# Patient Record
Sex: Male | Born: 1955 | Race: White | Hispanic: No | Marital: Married | Smoking: Never smoker
Health system: Southern US, Community
[De-identification: ages and names within clinical notes are randomized; demographics above are authoritative.]

## PROBLEM LIST (undated history)

## (undated) DIAGNOSIS — K219 Gastro-esophageal reflux disease without esophagitis: Secondary | ICD-10-CM

## (undated) DIAGNOSIS — Z973 Presence of spectacles and contact lenses: Secondary | ICD-10-CM

## (undated) DIAGNOSIS — N201 Calculus of ureter: Secondary | ICD-10-CM

## (undated) DIAGNOSIS — E785 Hyperlipidemia, unspecified: Secondary | ICD-10-CM

## (undated) HISTORY — PX: TONSILLECTOMY: SUR1361

## (undated) HISTORY — DX: Hyperlipidemia, unspecified: E78.5

## (undated) HISTORY — PX: OTHER SURGICAL HISTORY: SHX169

## (undated) HISTORY — PX: INGUINAL HERNIA REPAIR: SUR1180

## (undated) HISTORY — DX: Gastro-esophageal reflux disease without esophagitis: K21.9

---

## 2005-03-07 ENCOUNTER — Observation Stay (HOSPITAL_COMMUNITY): Admission: EM | Admit: 2005-03-07 | Discharge: 2005-03-07 | Payer: Self-pay | Admitting: Emergency Medicine

## 2005-05-25 HISTORY — PX: COLONOSCOPY: SHX174

## 2012-11-08 ENCOUNTER — Ambulatory Visit (INDEPENDENT_AMBULATORY_CARE_PROVIDER_SITE_OTHER): Payer: PRIVATE HEALTH INSURANCE | Admitting: Emergency Medicine

## 2012-11-08 VITALS — BP 120/79 | HR 63 | Temp 97.8°F | Resp 16 | Ht 70.5 in | Wt 234.0 lb

## 2012-11-08 DIAGNOSIS — H8110 Benign paroxysmal vertigo, unspecified ear: Secondary | ICD-10-CM

## 2012-11-08 MED ORDER — ONDANSETRON HCL 4 MG/2ML IJ SOLN
4.0000 mg | Freq: Once | INTRAMUSCULAR | Status: AC
  Administered 2012-11-08: 4 mg via INTRAMUSCULAR

## 2012-11-08 MED ORDER — ONDANSETRON 8 MG PO TBDP: 8.0000 mg | ORAL_TABLET | Freq: Three times a day (TID) | ORAL | Status: DC | PRN

## 2012-11-08 MED ORDER — ONDANSETRON HCL 4 MG/2ML IJ SOLN: 2.0000 mg | Freq: Once | INTRAMUSCULAR | Status: DC

## 2012-11-08 NOTE — Addendum Note (Signed)
Addended by: Braxton Feathers on: 11/08/2012 09:44 AM   Modules accepted: Orders

## 2012-11-08 NOTE — Patient Instructions (Addendum)

## 2012-11-08 NOTE — Progress Notes (Signed)
[facility] [address], [place] [address] [phone]  Date:  11/08/2012   Name:  Michael Diaz   DOB:  Sep 03, 1955   MRN:  [id number]  PCP:  Georgann Housekeeper, MD    Chief Complaint: Dizziness and Emesis   History of Present Illness:  Michael Diaz is a 57 y.o. very pleasant male patient who presents with the following:   Ill with vertigo and nausea since Sunday.  [place] No head injury, nasal drainage, fever or chills.  No antecedent illness.  Nauseated this morning and vomiting in the office.  Says worse with change in orientation of head.  No improvement with over the counter medications or other home remedies. Denies other complaint or health concern today.  Nonsmoker, no high blood pressure.  No diabetes.   There are no active problems to display for this patient.   Past Medical History  Diagnosis Date  . GERD (gastroesophageal reflux disease)   . Hyperlipidemia     Past Surgical History  Procedure Laterality Date  . Hernia repair      History  Substance Use Topics  . Smoking status: Never Smoker   . Smokeless tobacco: Not on file  . Alcohol Use: Yes    Family History  Problem Relation Age of Onset  . Alzheimer's disease Mother   . Alzheimer's disease Father     No Known Allergies  Medication list has been reviewed and updated.  No current outpatient prescriptions on file prior to visit.   No current facility-administered medications on file prior to visit.    Review of Systems: As per HPI, otherwise negative.    Physical Examination: Filed Vitals:   11/08/12 0857  BP: 120/79  Pulse: 63  Temp: 97.8 F (36.6 C)  Resp: 16   Filed Vitals:   11/08/12 0857  Height: 5' 10.5" (1.791 m)  Weight: 234 lb (106.142 kg)   Body mass index is 33.09 kg/(m^2). Ideal Body Weight: Weight in (lb) to have BMI = 25: 176.4  GEN: WDWN, moderate distress, Non-toxic, A & O x 3 HEENT: Atraumatic,  Normocephalic. Neck supple. No masses, No LAD. Ears and Nose: No external deformity. CV: RRR, No M/G/R. No JVD. No thrill. No extra heart sounds. PULM: CTA B, no wheezes, crackles, rhonchi. No retractions. No resp. distress. No accessory muscle use. ABD: S, NT, ND, +BS. No rebound. No HSM. EXTR: No c/c/e NEURO impaired tandem gait and romberg.  Gross motor intact.  AAOx3, MAE, CN 2-12 intact.  PSYCH: Normally interactive. Conversant. Not depressed or anxious appearing.  Calm demeanor.    Assessment and Plan: Benign positional vertigo zofran antivert   Signed,  Phillips Odor, MD

## 2012-12-16 ENCOUNTER — Ambulatory Visit (INDEPENDENT_AMBULATORY_CARE_PROVIDER_SITE_OTHER): Payer: PRIVATE HEALTH INSURANCE | Admitting: Emergency Medicine

## 2012-12-16 VITALS — BP 110/74 | HR 85 | Temp 97.5°F | Resp 18 | Ht 69.5 in | Wt 236.0 lb

## 2012-12-16 DIAGNOSIS — J069 Acute upper respiratory infection, unspecified: Secondary | ICD-10-CM

## 2012-12-16 MED ORDER — PSEUDOEPHEDRINE-GUAIFENESIN ER 60-600 MG PO TB12: 1.0000 | ORAL_TABLET | Freq: Two times a day (BID) | ORAL | Status: DC

## 2012-12-16 MED ORDER — GUAIFENESIN-DM 100-10 MG/5ML PO SYRP: 5.0000 mL | ORAL_SOLUTION | Freq: Four times a day (QID) | ORAL | Status: DC | PRN

## 2012-12-16 NOTE — Progress Notes (Signed)
  Subjective:     Michael Diaz is a 57 y.o. male who presents for evaluation of symptoms of a URI. Symptoms include congestion, cough described as nonproductive and low grade fever. Onset of symptoms was 5 days ago, and has been gradually improving since that time. Treatment to date: antihistamines, cough suppressants and decongestants.  The following portions of the patient's history were reviewed and updated as appropriate: allergies, current medications, past family history, past medical history, past social history, past surgical history and problem list.  Review of Systems A comprehensive review of systems was negative.   Objective:    BP 110/74  Pulse 85  Temp(Src) 97.5 F (36.4 C) (Oral)  Resp 18  Ht 5' 9.5" (1.765 m)  Wt 236 lb (107.049 kg)  BMI 34.36 kg/m2  SpO2 96%  General Appearance:    Alert, cooperative, no distress, appears stated age  Head:    Normocephalic, without obvious abnormality, atraumatic  Eyes:    PERRL, conjunctiva/corneas clear, EOM's intact, fundi    benign, both eyes       Ears:    Normal TM's and external ear canals, both ears  Nose:   Nares normal, septum midline, mucosa normal, no drainage    or sinus tenderness  Throat:   Lips, mucosa, and tongue normal; teeth and gums normal  Neck:   Supple, symmetrical, trachea midline, no adenopathy;       thyroid:  No enlargement/tenderness/nodules; no carotid   bruit or JVD  Back:     Symmetric, no curvature, ROM normal, no CVA tenderness  Lungs:     Clear to auscultation bilaterally, respirations unlabored  Chest wall:    No tenderness or deformity  Heart:    Regular rate and rhythm, S1 and S2 normal, no murmur, rub   or gallop           Extremities:   Extremities normal, atraumatic, no cyanosis or edema     Skin:   Skin color, texture, turgor normal, no rashes or lesions  Lymph nodes:   Cervical, supraclavicular, and axillary nodes normal  Neurologic:   CNII-XII intact. Normal strength, sensation and  reflexes      throughout     Assessment:    viral upper respiratory illness   Plan:    Discussed diagnosis and treatment of URI. Suggested symptomatic OTC remedies. Follow up as needed.

## 2012-12-16 NOTE — Patient Instructions (Addendum)

## 2013-12-11 ENCOUNTER — Ambulatory Visit (INDEPENDENT_AMBULATORY_CARE_PROVIDER_SITE_OTHER): Payer: PRIVATE HEALTH INSURANCE | Admitting: Family Medicine

## 2013-12-11 VITALS — BP 122/74 | HR 77 | Temp 98.1°F | Resp 16 | Ht 71.0 in | Wt 238.4 lb

## 2013-12-11 DIAGNOSIS — T148 Other injury of unspecified body region: Secondary | ICD-10-CM

## 2013-12-11 DIAGNOSIS — W57XXXA Bitten or stung by nonvenomous insect and other nonvenomous arthropods, initial encounter: Secondary | ICD-10-CM

## 2013-12-11 DIAGNOSIS — J069 Acute upper respiratory infection, unspecified: Secondary | ICD-10-CM

## 2013-12-11 DIAGNOSIS — R21 Rash and other nonspecific skin eruption: Secondary | ICD-10-CM

## 2013-12-11 MED ORDER — AMOXICILLIN-POT CLAVULANATE 875-125 MG PO TABS: 1.0000 | ORAL_TABLET | Freq: Two times a day (BID) | ORAL | Status: DC

## 2013-12-11 NOTE — Progress Notes (Signed)
Chief Complaint:  Chief Complaint  Patient presents with  . Insect Bite    on the left side and back  . Sinusitis    x 1 week    HPI: Michael Diaz is a 58 y.o. male who is here for  1 week history of cold like sxs, diarrhea, head aches, sinus aches, ear popping, had fevers and chills earlier on ,  no rashes. HE was in [place] traveling at the time.  He also has a bug bite on his left flank near belt line, it stung so he does not think it was a tick. IT is itchy and red. Minimally tender.    Past Medical History  Diagnosis Date  . GERD (gastroesophageal reflux disease)   . Hyperlipidemia    Past Surgical History  Procedure Laterality Date  . Hernia repair     History   Social History  . Marital Status: Married    Spouse Name: N/A    Number of Children: N/A  . Years of Education: N/A   Social History Main Topics  . Smoking status: Never Smoker   . Smokeless tobacco: None  . Alcohol Use: Yes  . Drug Use: No  . Sexual Activity: None   Other Topics Concern  . None   Social History Narrative  . None   Family History  Problem Relation Age of Onset  . Alzheimer's disease Mother   . Alzheimer's disease Father    No Known Allergies Prior to Admission medications   Not on File     ROS: The patient denies currently fevers, chills, night sweats, unintentional weight loss, chest pain, palpitations, wheezing, dyspnea on exertion, nausea, vomiting, abdominal pain, dysuria, hematuria, melena, numbness, weakness, or tingling.  All other systems have been reviewed and were otherwise negative with the exception of those mentioned in the HPI and as above.    PHYSICAL EXAM: Filed Vitals:   12/11/13 1226  BP: 122/74  Pulse: 77  Temp: 98.1 F (36.7 C)  Resp: 16   Filed Vitals:   12/11/13 1226  Height: 5\' 11"  (1.803 m)  Weight: 238 lb 6.4 oz (108.138 kg)   Body mass index is 33.26 kg/(m^2).  General: Alert, no acute distress HEENT:  Normocephalic,  atraumatic, oropharynx patent. EOMI, PERRLA. TM nl, + erythme thorat and sinus tenderness, no exudates Cardiovascular:  Regular rate and rhythm, no rubs murmurs or gallops.  No Carotid bruits, radial pulse intact. No pedal edema.  Respiratory: Clear to auscultation bilaterally.  No wheezes, rales, or rhonchi.  No cyanosis, no use of accessory musculature GI: No organomegaly, abdomen is soft and non-tender, positive bowel sounds.  No masses. Skin: + left flank rash, early cellulitis.  Neurologic: Facial musculature symmetric. Psychiatric: Patient is appropriate throughout our interaction. Lymphatic: No cervical lymphadenopathy Musculoskeletal: Gait intact.   LABS: No results found for this or any previous visit.   EKG/XRAY:   Primary read interpreted by Dr. Marin Comment at [facility].   ASSESSMENT/PLAN: Encounter Diagnoses  Name Primary?  . Acute upper respiratory infections of unspecified site Yes  . Insect bite   . Rash and nonspecific skin eruption    Augmentin BID x 10 days Monitor for worsening cellulitis sxs from insect bite, not c/w  tick bite from history  F/u prn  Gross sideeffects, risk and benefits, and alternatives of medications d/w patient. Patient is aware that all medications have potential sideeffects and we are unable to predict every sideeffect or drug-drug interaction that  may occur.  Caysie Minnifield, [place], DO 12/12/2013 10:18 AM

## 2014-01-15 ENCOUNTER — Ambulatory Visit: Payer: PRIVATE HEALTH INSURANCE

## 2014-01-16 ENCOUNTER — Emergency Department (HOSPITAL_COMMUNITY)
Admission: EM | Admit: 2014-01-16 | Discharge: 2014-01-16 | Disposition: A | Discharge status: Home / Self Care | Payer: PRIVATE HEALTH INSURANCE | Attending: Emergency Medicine | Admitting: Emergency Medicine

## 2014-01-16 ENCOUNTER — Encounter (HOSPITAL_COMMUNITY): Payer: Self-pay | Admitting: Emergency Medicine

## 2014-01-16 ENCOUNTER — Emergency Department (HOSPITAL_COMMUNITY): Payer: PRIVATE HEALTH INSURANCE

## 2014-01-16 DIAGNOSIS — N201 Calculus of ureter: Secondary | ICD-10-CM | POA: Insufficient documentation

## 2014-01-16 DIAGNOSIS — Z8639 Personal history of other endocrine, nutritional and metabolic disease: Secondary | ICD-10-CM | POA: Diagnosis not present

## 2014-01-16 DIAGNOSIS — R1031 Right lower quadrant pain: Secondary | ICD-10-CM | POA: Insufficient documentation

## 2014-01-16 DIAGNOSIS — Z792 Long term (current) use of antibiotics: Secondary | ICD-10-CM | POA: Insufficient documentation

## 2014-01-16 DIAGNOSIS — D72829 Elevated white blood cell count, unspecified: Secondary | ICD-10-CM | POA: Insufficient documentation

## 2014-01-16 DIAGNOSIS — M549 Dorsalgia, unspecified: Secondary | ICD-10-CM | POA: Diagnosis present

## 2014-01-16 DIAGNOSIS — Z862 Personal history of diseases of the blood and blood-forming organs and certain disorders involving the immune mechanism: Secondary | ICD-10-CM | POA: Diagnosis not present

## 2014-01-16 DIAGNOSIS — Z8719 Personal history of other diseases of the digestive system: Secondary | ICD-10-CM | POA: Diagnosis not present

## 2014-01-16 DIAGNOSIS — R112 Nausea with vomiting, unspecified: Secondary | ICD-10-CM | POA: Diagnosis not present

## 2014-01-16 LAB — COMPREHENSIVE METABOLIC PANEL
ALT: 16 U/L (ref 0–53)
AST: 17 U/L (ref 0–37)
Albumin: 4.2 g/dL (ref 3.5–5.2)
Alkaline Phosphatase: 94 U/L (ref 39–117)
Anion gap: 16 — ABNORMAL HIGH (ref 5–15)
BUN: 16 mg/dL (ref 6–23)
CO2: 23 mEq/L (ref 19–32)
Calcium: 9.8 mg/dL (ref 8.4–10.5)
Chloride: 101 mEq/L (ref 96–112)
Creatinine, Ser: 1.37 mg/dL — ABNORMAL HIGH (ref 0.50–1.35)
GFR calc Af Amer: 64 mL/min — ABNORMAL LOW (ref >90)
GFR calc non Af Amer: 55 mL/min — ABNORMAL LOW (ref >90)
Glucose, Bld: 129 mg/dL — ABNORMAL HIGH (ref 70–99)
POTASSIUM: 4.5 meq/L (ref 3.7–5.3)
Sodium: 140 mEq/L (ref 137–147)
Total Bilirubin: 0.4 mg/dL (ref 0.3–1.2)
Total Protein: 8.5 g/dL — ABNORMAL HIGH (ref 6.0–8.3)

## 2014-01-16 LAB — URINALYSIS, ROUTINE W REFLEX MICROSCOPIC
Bilirubin Urine: NEGATIVE
Glucose, UA: NEGATIVE mg/dL
Ketones, ur: 15 mg/dL — AB
LEUKOCYTES UA: NEGATIVE
Nitrite: NEGATIVE
PROTEIN: NEGATIVE mg/dL
SPECIFIC GRAVITY, URINE: 1.02 (ref 1.005–1.030)
Urobilinogen, UA: 0.2 mg/dL (ref 0.0–1.0)
pH: 6.5 (ref 5.0–8.0)

## 2014-01-16 LAB — CBC WITH DIFFERENTIAL/PLATELET
Basophils Absolute: 0 10*3/uL (ref 0.0–0.1)
Basophils Relative: 0 % (ref 0–1)
Eosinophils Absolute: 0.1 10*3/uL (ref 0.0–0.7)
Eosinophils Relative: 1 % (ref 0–5)
HCT: 43.8 % (ref 39.0–52.0)
Hemoglobin: 14.6 g/dL (ref 13.0–17.0)
Lymphocytes Relative: 13 % (ref 12–46)
Lymphs Abs: 1.7 10*3/uL (ref 0.7–4.0)
MCH: 28.7 pg (ref 26.0–34.0)
MCHC: 33.3 g/dL (ref 30.0–36.0)
MCV: 86.1 fL (ref 78.0–100.0)
Monocytes Absolute: 0.9 10*3/uL (ref 0.1–1.0)
Monocytes Relative: 7 % (ref 3–12)
NEUTROS PCT: 79 % — AB (ref 43–77)
Neutro Abs: 10.1 10*3/uL — ABNORMAL HIGH (ref 1.7–7.7)
Platelets: 309 10*3/uL (ref 150–400)
RBC: 5.09 MIL/uL (ref 4.22–5.81)
RDW: 13.8 % (ref 11.5–15.5)
WBC: 12.7 10*3/uL — ABNORMAL HIGH (ref 4.0–10.5)

## 2014-01-16 LAB — URINE MICROSCOPIC-ADD ON

## 2014-01-16 LAB — LIPASE, BLOOD: Lipase: 20 U/L (ref 11–59)

## 2014-01-16 MED ORDER — TAMSULOSIN HCL 0.4 MG PO CAPS: 0.4000 mg | ORAL_CAPSULE | Freq: Every day | ORAL | Status: DC

## 2014-01-16 MED ORDER — SODIUM CHLORIDE 0.9 % IV BOLUS (SEPSIS)
1000.0000 mL | Freq: Once | INTRAVENOUS | Status: AC
  Administered 2014-01-16: 1000 mL via INTRAVENOUS

## 2014-01-16 MED ORDER — FENTANYL CITRATE 0.05 MG/ML IJ SOLN
100.0000 ug | Freq: Once | INTRAMUSCULAR | Status: AC
  Administered 2014-01-16: 100 ug via INTRAVENOUS
  Filled 2014-01-16: qty 2

## 2014-01-16 MED ORDER — ONDANSETRON HCL 4 MG/2ML IJ SOLN
4.0000 mg | Freq: Once | INTRAMUSCULAR | Status: AC
  Administered 2014-01-16: 4 mg via INTRAVENOUS
  Filled 2014-01-16: qty 2

## 2014-01-16 MED ORDER — IOHEXOL 300 MG/ML  SOLN
50.0000 mL | Freq: Once | INTRAMUSCULAR | Status: AC | PRN
  Administered 2014-01-16: 50 mL via ORAL

## 2014-01-16 MED ORDER — HYDROMORPHONE HCL PF 1 MG/ML IJ SOLN
1.0000 mg | Freq: Once | INTRAMUSCULAR | Status: AC
  Administered 2014-01-16: 1 mg via INTRAVENOUS
  Filled 2014-01-16: qty 1

## 2014-01-16 MED ORDER — PROMETHAZINE HCL 25 MG PO TABS: 25.0000 mg | ORAL_TABLET | Freq: Four times a day (QID) | ORAL | Status: DC | PRN

## 2014-01-16 MED ORDER — IOHEXOL 300 MG/ML  SOLN
100.0000 mL | Freq: Once | INTRAMUSCULAR | Status: AC | PRN
  Administered 2014-01-16: 100 mL via INTRAVENOUS

## 2014-01-16 MED ORDER — METOCLOPRAMIDE HCL 5 MG/ML IJ SOLN
10.0000 mg | Freq: Once | INTRAMUSCULAR | Status: AC
  Administered 2014-01-16: 10 mg via INTRAVENOUS
  Filled 2014-01-16: qty 2

## 2014-01-16 MED ORDER — OXYCODONE-ACETAMINOPHEN 5-325 MG PO TABS
1.0000 | ORAL_TABLET | Freq: Four times a day (QID) | ORAL | Status: DC | PRN
Start: 2014-01-16 — End: 2014-09-11

## 2014-01-16 MED ORDER — PROMETHAZINE HCL 25 MG/ML IJ SOLN
25.0000 mg | Freq: Once | INTRAMUSCULAR | Status: AC
  Administered 2014-01-16: 25 mg via INTRAVENOUS
  Filled 2014-01-16: qty 1

## 2014-01-16 NOTE — ED Notes (Signed)
Bed: UU82 Expected date:  Expected time:  Means of arrival:  Comments: Hold for tri 1

## 2014-01-16 NOTE — ED Notes (Addendum)
Pt from home c/o low back pain and groin pain since yesterday morning. He was seen at [facility]  DX with UTI and given abx and phernergan. Pt has had multiple episodes of vomiting. One phenergan taken at 1000 this am.

## 2014-01-16 NOTE — ED Notes (Signed)
Patient transported to CT 

## 2014-01-16 NOTE — ED Notes (Signed)
Coke given for fluid challenge.  

## 2014-01-16 NOTE — ED Provider Notes (Signed)
CSN: [id number]     Arrival date & time 01/16/14  1432 History   First MD Initiated Contact with Patient 01/16/14 1503     Chief Complaint  Patient presents with  . Emesis  . Back Pain  . Urinary Tract Infection   Patient is a 58 y.o. male presenting with vomiting, back pain, and urinary tract infection.  Emesis Back Pain Urinary Tract Infection Associated symptoms include vomiting.    Patient is a 58 y.o. Male who presents to the ED with right sided intermittent flank pain which radiates to the groin.  Patient states that this pain started yesterday morning.  Patient states that the pain increased today, and he has subsequently developed nausea and vomiting.  Patient states that he tried taking ibuprofen at home with some relief of his pain.   Patient estimates that he has vomited upwards of 10 times today and is now just dry heaving.  Patient went to see his PCP and his PCP was concerned that he may have a UTI.  Patient denies dysuria, hematuria, frequency, urgency.   Patient also denies history of kidney stones.  Patient denies fever, chills, chest pain, shortness of breath, diarrhea, melena, constipation hematochezia.  All other ROS are negative at this time.   Past Medical History  Diagnosis Date  . GERD (gastroesophageal reflux disease)   . Hyperlipidemia    Past Surgical History  Procedure Laterality Date  . Hernia repair     Family History  Problem Relation Age of Onset  . Alzheimer's disease Mother   . Alzheimer's disease Father    History  Substance Use Topics  . Smoking status: Never Smoker   . Smokeless tobacco: Not on file  . Alcohol Use: Yes    Review of Systems  Gastrointestinal: Positive for vomiting.  Musculoskeletal: Positive for back pain.   See HPI   Allergies  Review of patient's allergies indicates no known allergies.  Home Medications   Prior to Admission medications   Medication Sig Start Date End Date Taking? Authorizing Provider  ibuprofen  (ADVIL,MOTRIN) 200 MG tablet Take 400 mg by mouth every 4 (four) hours as needed for moderate pain.   Yes Historical Provider, MD  promethazine (PHENERGAN) 25 MG suppository Place 25 mg rectally every 6 (six) hours as needed for nausea or vomiting.   Yes Historical Provider, MD  sulfamethoxazole-trimethoprim (BACTRIM DS) 800-160 MG per tablet Take 1 tablet by mouth 2 (two) times daily.   Yes Historical Provider, MD  oxyCODONE-acetaminophen (PERCOCET/ROXICET) 5-325 MG per tablet Take 1-2 tablets by mouth every 6 (six) hours as needed for moderate pain or severe pain. 01/16/14   Rawlin Reaume A Forcucci, PA-C  promethazine (PHENERGAN) 25 MG tablet Take 1 tablet (25 mg total) by mouth every 6 (six) hours as needed for nausea or vomiting. 01/16/14   Laylonie Marzec A Forcucci, PA-C  tamsulosin (FLOMAX) 0.4 MG CAPS capsule Take 1 capsule (0.4 mg total) by mouth daily. 01/16/14   Rashaun Wichert A Forcucci, PA-C   BP 115/68  Pulse 80  Temp(Src) 98.1 F (36.7 C) (Oral)  Resp 16  SpO2 96% Physical Exam  Nursing note and vitals reviewed. Constitutional: He is oriented to person, place, and time. He appears well-developed and well-nourished. No distress.  HENT:  Head: Normocephalic and atraumatic.  Mouth/Throat: Oropharynx is clear and moist.  Eyes: Conjunctivae and EOM are normal. Pupils are equal, round, and reactive to light. No scleral icterus.  Neck: Normal range of motion. Neck supple. No JVD present. No  thyromegaly present.  Cardiovascular: Normal rate, regular rhythm, normal heart sounds and intact distal pulses.  Exam reveals no gallop and no friction rub.   No murmur heard. Pulmonary/Chest: Effort normal and breath sounds normal. No respiratory distress. He has no wheezes. He has no rales. He exhibits no tenderness.  Abdominal: Soft. Normal appearance and bowel sounds are normal. He exhibits no distension and no mass. There is tenderness in the right lower quadrant. There is CVA tenderness. There is no rebound, no  guarding, no tenderness at McBurney's point and negative Murphy's sign.  Musculoskeletal: Normal range of motion.  Lymphadenopathy:    He has no cervical adenopathy.  Neurological: He is alert and oriented to person, place, and time. No cranial nerve deficit. Coordination normal.  Skin: Skin is warm and dry. He is not diaphoretic.  Psychiatric: He has a normal mood and affect. His behavior is normal. Judgment and thought content normal.    ED Course  Procedures (including critical care time) Labs Review Labs Reviewed  CBC WITH DIFFERENTIAL - Abnormal; Notable for the following:    WBC 12.7 (*)    Neutrophils Relative % 79 (*)    Neutro Abs 10.1 (*)    All other components within normal limits  COMPREHENSIVE METABOLIC PANEL - Abnormal; Notable for the following:    Glucose, Bld 129 (*)    Creatinine, Ser 1.37 (*)    Total Protein 8.5 (*)    GFR calc non Af Amer 55 (*)    GFR calc Af Amer 64 (*)    Anion gap 16 (*)    All other components within normal limits  URINALYSIS, ROUTINE W REFLEX MICROSCOPIC - Abnormal; Notable for the following:    Hgb urine dipstick TRACE (*)    Ketones, ur 15 (*)    All other components within normal limits  LIPASE, BLOOD  URINE MICROSCOPIC-ADD ON    Imaging Review Ct Abdomen Pelvis W Contrast  01/16/2014   CLINICAL DATA:  Right lower quadrant pain, back pain and emesis. Melanoma skin resection 6 months ago.  EXAM: CT ABDOMEN AND PELVIS WITH CONTRAST  TECHNIQUE: Multidetector CT imaging of the abdomen and pelvis was performed using the standard protocol following bolus administration of intravenous contrast.  CONTRAST:  97mL OMNIPAQUE IOHEXOL 300 MG/ML SOLN, 161mL OMNIPAQUE IOHEXOL 300 MG/ML SOLN  COMPARISON:  None.  FINDINGS: Lung bases demonstrate mild linear scarring versus atelectasis in the posterior right base.  Abdominal images demonstrate a normal liver, spleen, pancreas, gallbladder and adrenal glands. Appendix is normal. There is minimal  calcified plaque over the distal abdominal aorta just above the bifurcation.  Kidneys are normal in size with mild right-sided hydronephrosis and right perinephric stranding/ fluid. No evidence of nephrolithiasis. There is mild dilatation of the right ureter as there is a 3.5 mm stone over the distal right ureter just above the UVJ. Left ureter is normal.  Pelvic images demonstrate minimal bladder distention. The prostate and rectosigmoid colon are within normal. There are multiple pelvic phleboliths present. There are mild degenerate changes of the spine and hips. There is a grade 1 anterolisthesis of L5 on S1 due to bilateral L5 spondylolysis.  IMPRESSION: 3.5 mm stone over the distal right ureter just above the UVJ causing low-grade obstruction.  Grade 1 anterolisthesis of L5 on S1 due to bilateral L5 spondylolysis.   Electronically Signed   By: Marin Olp M.D.   On: 01/16/2014 18:31     EKG Interpretation None  MDM   Final diagnoses:  Ureterolithiasis  Non-intractable vomiting with nausea, vomiting of unspecified type   Patient is a 58 y.o. Male who presents to the ED with right flank pain.  Physical examination reveals right CVA tenderness and a non-tender abdomen.  CBC shows mild leukocytosis.  UA is negative for infection at this time and does show some mild microhematuria.  BMP shows some increase in SCr.  CT of the abdomen and pelvis shows a 3.5 mm ureteral stone just above the UVJ with low grade obstruction.  Patient was treated here with 2 L NS bolus, dilaudid, fentanyl, zofran, phenergan, and reglan.  Pain was well controlled with pain medication here at this time.  Patient was able to tolerate PO fluids here.  Patient will be discharged home with flomax, percocet, and phenergan.  Patient is to follow-up with urology and call to make an appointment in the morning.  Patient was told to return to the ED with signs of septic stone or decreased urine output.  Patient is stable for  discharge at this time.  He states understanding and agreement to the above plan.  Patient was also seen by Dr. Wyvonnia Dusky who agrees with the above workup and plan.      Cherylann Parr, PA-C 01/16/14 2238

## 2014-01-16 NOTE — Discharge Instructions (Signed)

## 2014-01-16 NOTE — ED Notes (Signed)
Pt aware of the need for urine sample however, is unable to void at this time.

## 2014-01-17 NOTE — ED Provider Notes (Signed)
Medical screening examination/treatment/procedure(s) were conducted as a shared visit with non-physician practitioner(s) and myself.  I personally evaluated the patient during the encounter.  R flank/groin pain since yesterday, intermittent with nausea and vomiting. Treated for UTI yesterday. TTP R flank and RLQ. No testicular pain. UA negative for infection.   EKG Interpretation None       Ezequiel Essex, MD 01/17/14 [phone]

## 2014-01-18 ENCOUNTER — Encounter (HOSPITAL_BASED_OUTPATIENT_CLINIC_OR_DEPARTMENT_OTHER): Payer: Self-pay | Admitting: *Deleted

## 2014-01-18 ENCOUNTER — Other Ambulatory Visit: Payer: Self-pay | Admitting: Urology

## 2014-01-18 NOTE — Progress Notes (Signed)
01/18/14 1127  OBSTRUCTIVE SLEEP APNEA  Have you ever been diagnosed with sleep apnea through a sleep study? No  Do you snore loudly (loud enough to be heard through closed doors)?  1  Do you often feel tired, fatigued, or sleepy during the daytime? 0  Has anyone observed you stop breathing during your sleep? 0  Do you have, or are you being treated for high blood pressure? 0  BMI more than 35 kg/m2? 0  Age over 58 years old? 1  Neck circumference greater than 40 cm/16 inches? 1  Gender: 1  Obstructive Sleep Apnea Score 4  Score 4 or greater  Results sent to PCP

## 2014-01-18 NOTE — Progress Notes (Addendum)
NPO AFTER MN. ARRIVE AT 0715. NEEDS KUB. CURRENT LAB RESULTS IN CHART AND EPIC. MAY TAKE PAIN/ NAUSEA RX IF NEEDED W/ SIPS OF WATER AM DOS.

## 2014-01-19 ENCOUNTER — Encounter (HOSPITAL_BASED_OUTPATIENT_CLINIC_OR_DEPARTMENT_OTHER): Payer: Self-pay | Admitting: *Deleted

## 2014-01-19 ENCOUNTER — Ambulatory Visit (HOSPITAL_BASED_OUTPATIENT_CLINIC_OR_DEPARTMENT_OTHER): Payer: PRIVATE HEALTH INSURANCE | Admitting: Anesthesiology

## 2014-01-19 ENCOUNTER — Encounter (HOSPITAL_BASED_OUTPATIENT_CLINIC_OR_DEPARTMENT_OTHER)
Admission: RE | Disposition: A | Discharge status: Home / Self Care | Payer: Self-pay | Source: Ambulatory Visit | Attending: Urology

## 2014-01-19 ENCOUNTER — Encounter (HOSPITAL_BASED_OUTPATIENT_CLINIC_OR_DEPARTMENT_OTHER): Payer: PRIVATE HEALTH INSURANCE | Admitting: Anesthesiology

## 2014-01-19 ENCOUNTER — Ambulatory Visit (HOSPITAL_BASED_OUTPATIENT_CLINIC_OR_DEPARTMENT_OTHER)
Admission: RE | Admit: 2014-01-19 | Discharge: 2014-01-19 | Disposition: A | Discharge status: Home / Self Care | Payer: PRIVATE HEALTH INSURANCE | Source: Ambulatory Visit | Attending: Urology | Admitting: Urology

## 2014-01-19 ENCOUNTER — Ambulatory Visit (HOSPITAL_COMMUNITY): Payer: PRIVATE HEALTH INSURANCE

## 2014-01-19 DIAGNOSIS — N400 Enlarged prostate without lower urinary tract symptoms: Secondary | ICD-10-CM | POA: Insufficient documentation

## 2014-01-19 DIAGNOSIS — Z85820 Personal history of malignant melanoma of skin: Secondary | ICD-10-CM | POA: Insufficient documentation

## 2014-01-19 DIAGNOSIS — N201 Calculus of ureter: Secondary | ICD-10-CM | POA: Insufficient documentation

## 2014-01-19 DIAGNOSIS — Z79899 Other long term (current) drug therapy: Secondary | ICD-10-CM | POA: Diagnosis not present

## 2014-01-19 HISTORY — DX: Presence of spectacles and contact lenses: Z97.3

## 2014-01-19 HISTORY — DX: Calculus of ureter: N20.1

## 2014-01-19 HISTORY — PX: CYSTOSCOPY WITH URETEROSCOPY AND STENT PLACEMENT: SHX6377

## 2014-01-19 SURGERY — CYSTOURETEROSCOPY, WITH STENT INSERTION
Anesthesia: General | Site: Ureter | Laterality: Right

## 2014-01-19 MED ORDER — CIPROFLOXACIN IN D5W 400 MG/200ML IV SOLN
400.0000 mg | INTRAVENOUS | Status: AC
  Administered 2014-01-19: 400 mg via INTRAVENOUS
  Filled 2014-01-19: qty 200

## 2014-01-19 MED ORDER — DEXAMETHASONE SODIUM PHOSPHATE 4 MG/ML IJ SOLN
INTRAMUSCULAR | Status: DC | PRN
  Administered 2014-01-19: 8 mg via INTRAVENOUS

## 2014-01-19 MED ORDER — MIDAZOLAM HCL 5 MG/5ML IJ SOLN
INTRAMUSCULAR | Status: DC | PRN
  Administered 2014-01-19: 2 mg via INTRAVENOUS

## 2014-01-19 MED ORDER — PROPOFOL 10 MG/ML IV BOLUS
INTRAVENOUS | Status: DC | PRN
  Administered 2014-01-19: 200 mg via INTRAVENOUS

## 2014-01-19 MED ORDER — CIPROFLOXACIN IN D5W 400 MG/200ML IV SOLN
INTRAVENOUS | Status: AC
  Filled 2014-01-19: qty 200

## 2014-01-19 MED ORDER — SODIUM CHLORIDE 0.9 % IR SOLN
Status: DC | PRN
  Administered 2014-01-19: 4000 mL

## 2014-01-19 MED ORDER — FENTANYL CITRATE 0.05 MG/ML IJ SOLN
25.0000 ug | INTRAMUSCULAR | Status: DC | PRN
  Filled 2014-01-19: qty 1

## 2014-01-19 MED ORDER — MIDAZOLAM HCL 2 MG/2ML IJ SOLN
INTRAMUSCULAR | Status: AC
  Filled 2014-01-19: qty 2

## 2014-01-19 MED ORDER — FENTANYL CITRATE 0.05 MG/ML IJ SOLN
INTRAMUSCULAR | Status: DC | PRN
  Administered 2014-01-19 (×2): 50 ug via INTRAVENOUS

## 2014-01-19 MED ORDER — LACTATED RINGERS IV SOLN
INTRAVENOUS | Status: DC
  Administered 2014-01-19: 08:00:00 via INTRAVENOUS
  Filled 2014-01-19: qty 1000

## 2014-01-19 MED ORDER — IOHEXOL 350 MG/ML SOLN
INTRAVENOUS | Status: DC | PRN
  Administered 2014-01-19: 20 mL via URETHRAL

## 2014-01-19 MED ORDER — PROMETHAZINE HCL 25 MG/ML IJ SOLN
6.2500 mg | INTRAMUSCULAR | Status: DC | PRN
  Filled 2014-01-19: qty 1

## 2014-01-19 MED ORDER — LACTATED RINGERS IV SOLN
INTRAVENOUS | Status: DC
  Filled 2014-01-19: qty 1000

## 2014-01-19 MED ORDER — KETOROLAC TROMETHAMINE 30 MG/ML IJ SOLN
INTRAMUSCULAR | Status: DC | PRN
  Administered 2014-01-19: 30 mg via INTRAVENOUS

## 2014-01-19 MED ORDER — MEPERIDINE HCL 25 MG/ML IJ SOLN
6.2500 mg | INTRAMUSCULAR | Status: DC | PRN
  Filled 2014-01-19: qty 1

## 2014-01-19 MED ORDER — ONDANSETRON HCL 4 MG/2ML IJ SOLN
INTRAMUSCULAR | Status: DC | PRN
  Administered 2014-01-19: 4 mg via INTRAVENOUS

## 2014-01-19 MED ORDER — LIDOCAINE HCL (CARDIAC) 20 MG/ML IV SOLN
INTRAVENOUS | Status: DC | PRN
  Administered 2014-01-19: 60 mg via INTRAVENOUS

## 2014-01-19 MED ORDER — FENTANYL CITRATE 0.05 MG/ML IJ SOLN
INTRAMUSCULAR | Status: AC
  Filled 2014-01-19: qty 6

## 2014-01-19 SURGICAL SUPPLY — 48 items
ADAPTER CATH URET PLST 4-6FR (CATHETERS) IMPLANT
ADPR CATH URET STRL DISP 4-6FR (CATHETERS)
BAG DRAIN URO-CYSTO SKYTR STRL (DRAIN) ×3 IMPLANT
BAG DRN UROCATH (DRAIN) ×2
BASKET LASER NITINOL 1.9FR (BASKET) IMPLANT
BASKET SEGURA 3FR (UROLOGICAL SUPPLIES) IMPLANT
BASKET STNLS GEMINI 4WIRE 3FR (BASKET) IMPLANT
BASKET ZERO TIP NITINOL 2.4FR (BASKET) ×2 IMPLANT
BSKT STON RTRVL 120 1.9FR (BASKET)
BSKT STON RTRVL GEM 120X11 3FR (BASKET)
BSKT STON RTRVL ZERO TP 2.4FR (BASKET) ×2
CANISTER SUCT LVC 12 LTR MEDI- (MISCELLANEOUS) ×2 IMPLANT
CATH INTERMIT  6FR 70CM (CATHETERS) IMPLANT
CATH URET 5FR 28IN CONE TIP (BALLOONS) ×1
CATH URET 5FR 28IN OPEN ENDED (CATHETERS) IMPLANT
CATH URET 5FR 70CM CONE TIP (BALLOONS) ×1 IMPLANT
CLOTH BEACON ORANGE TIMEOUT ST (SAFETY) ×3 IMPLANT
DRAPE CAMERA CLOSED 9X96 (DRAPES) ×3 IMPLANT
ELECT REM PT RETURN 9FT ADLT (ELECTROSURGICAL)
ELECTRODE REM PT RTRN 9FT ADLT (ELECTROSURGICAL) IMPLANT
FIBER LASER FLEXIVA 1000 (UROLOGICAL SUPPLIES) IMPLANT
FIBER LASER FLEXIVA 200 (UROLOGICAL SUPPLIES) IMPLANT
FIBER LASER FLEXIVA 365 (UROLOGICAL SUPPLIES) IMPLANT
FIBER LASER FLEXIVA 550 (UROLOGICAL SUPPLIES) IMPLANT
GLOVE BIO SURGEON STRL SZ7 (GLOVE) ×3 IMPLANT
GLOVE BIOGEL PI IND STRL 7.0 (GLOVE) ×1 IMPLANT
GLOVE BIOGEL PI IND STRL 7.5 (GLOVE) ×1 IMPLANT
GLOVE BIOGEL PI INDICATOR 7.0 (GLOVE) ×1
GLOVE BIOGEL PI INDICATOR 7.5 (GLOVE) ×1
GOWN STRL REUS W/TWL LRG LVL3 (GOWN DISPOSABLE) ×2 IMPLANT
GOWN STRL REUS W/TWL XL LVL3 (GOWN DISPOSABLE) ×2 IMPLANT
GOWN XL W/COTTON TOWEL STD (GOWNS) ×1 IMPLANT
GUIDEWIRE 0.038 PTFE COATED (WIRE) IMPLANT
GUIDEWIRE ANG ZIPWIRE 038X150 (WIRE) IMPLANT
GUIDEWIRE STR DUAL SENSOR (WIRE) ×2 IMPLANT
IV NS 1000ML (IV SOLUTION) ×3
IV NS 1000ML BAXH (IV SOLUTION) ×1 IMPLANT
IV NS IRRIG 3000ML ARTHROMATIC (IV SOLUTION) ×4 IMPLANT
KIT BALLIN UROMAX 15FX10 (LABEL) IMPLANT
KIT BALLN UROMAX 15FX4 (MISCELLANEOUS) IMPLANT
KIT BALLN UROMAX 26 75X4 (MISCELLANEOUS)
PACK CYSTOSCOPY (CUSTOM PROCEDURE TRAY) ×3 IMPLANT
SET HIGH PRES BAL DIL (LABEL)
SHEATH ACCESS URETERAL 38CM (SHEATH) IMPLANT
SHEATH ACCESS URETERAL 54CM (SHEATH) IMPLANT
SHEATH URET ACCESS 12FR/35CM (UROLOGICAL SUPPLIES) ×2 IMPLANT
SHEATH URET ACCESS 12FR/55CM (UROLOGICAL SUPPLIES) IMPLANT
STENT URET 6FRX24 CONTOUR (STENTS) ×2 IMPLANT

## 2014-01-19 NOTE — H&P (Signed)
History of Present Illness Michael Diaz is a 58 yo WM who is sent from the ER for a right ureteral stone.  He had the onset Monday AM of right flank pain that was severe with nausea. He went to the ER that evening and was having vomiting. He has had no voiding complaints or hematuria. He has not had stones previously. He has a history of prostatitis and was last seen by [place] in 2004.  He is having dull pain on the med. He has a headache.   Past Medical History Problems  1. History of esophageal reflux (V12.79) 2. History of hypercholesterolemia (V12.29) 3. History of Melanoma of trunk (172.5)  Surgical History Problems  1. History of Chemosurgery (Mohs Micrographic Technique) 2. History of Inguinal Hernia Repair  Current Meds 1. Flomax 0.4 MG CPCR;  Therapy: (Recorded:27Aug2015) to Recorded 2. OxyCODONE HCl TABS;  Therapy: (Recorded:27Aug2015) to Recorded 3. Phenergan 25 MG TABS;  Therapy: (Recorded:27Aug2015) to Recorded  Allergies Medication  1. OxyCODONE HCl TABS  Family History Problems  1. Family history of Alzheimer's disease (V17.2) : Mother  Social History Problems    Alcohol use (V49.89)   Caffeine use (V49.89)   2 cups coffee a day   Married   Never a smoker   Number of children   2 daughters   Occupation   Herbalist  Review of Systems Genitourinary, constitutional, skin, eye, otolaryngeal, hematologic/lymphatic, cardiovascular, pulmonary, endocrine, musculoskeletal, gastrointestinal, neurological and psychiatric system(s) were reviewed and pertinent findings if present are noted.  Gastrointestinal: nausea, vomiting and constipation.  Endocrine: polydipsia.  Neurological: headache.    Vitals Vital Signs [Data Includes: Last 1 Day]  Recorded: 27Aug2015 08:48AM  Blood Pressure: 124 / 83 Temperature: 98.6 F Heart Rate: 91 Recorded: 27Aug2015 08:37AM  Height: 5 ft 9.5 in Weight: 237 lb  BMI Calculated: 34.5 BSA Calculated: 2.23  Physical  Exam Constitutional: Well nourished and well developed . No acute distress.  ENT:. The ears and nose are normal in appearance.  Neck: The appearance of the neck is normal and no neck mass is present.  Pulmonary: No respiratory distress and normal respiratory rhythm and effort.  Cardiovascular: Heart rate and rhythm are normal . No peripheral edema.  Abdomen: The abdomen is soft and nontender. No masses are palpated. No CVA tenderness. No hernias are palpable. No hepatosplenomegaly noted.  Lymphatics: The posterior cervical and supraclavicular nodes are not enlarged or tender.  Skin: Normal skin turgor, no visible rash and no visible skin lesions.  Neuro/Psych:. Mood and affect are appropriate.    Results/Data Urine [Data Includes: Last 1 Day]   27Aug2015  COLOR YELLOW   APPEARANCE CLEAR   SPECIFIC GRAVITY 1.010   pH 5.5   GLUCOSE NEG mg/dL  BILIRUBIN NEG   KETONE NEG mg/dL  BLOOD NEG   PROTEIN NEG mg/dL  UROBILINOGEN 0.2 mg/dL  NITRITE NEG   LEUKOCYTE ESTERASE NEG    Old records or history reviewed: ER notes reviewed.  The following images/tracing/specimen were independently visualized:  I have reviewed his CT films. He has a 3.62mm right distal stone with obstruction and no other stones. KUB today shows no change in the position of the 3.62mm RUVJ stone. There are several pelvic phleboliths but using scout line mode on the CT and CT scout, I am comfortable saying the stone is still present. There are no other significant findings and the gas patterns, bones and soft tissues are unremarkable.  The following clinical lab reports were reviewed:  UA reviewed.  Assessment Assessed  1. Calculus of right ureter (592.1)  He has a symptomatic 3.40mm right distal stone.   Plan Calculus of right ureter  1. Start: HYDROmorphone HCl - 2 MG Oral Tablet; TAKE 1 TABLET EVERY 4 TO 6 HOURS  AS NEEDED 2. Start: Ketorolac Tromethamine 10 MG Oral Tablet; TAKE 1 TABLET EVERY 6 HOURS AS   NEEDED 3. Follow-up Keep Future Appt Office  Follow-up  Status: Canceled - Appointment 4. Follow-up Schedule Surgery Office  Follow-up  Status: Hold For - Appointment   Requested for: 27Aug2015 5. KUB; Status:Resulted - Requires Verification;   Done: 27Aug2015 12:00AM  I discussed the options of continued MET, Ureteroscopy or ESWL which might be difficult with the surrounding phleboliths.  We are going to proceed with ureteroscopy tomorrow by Dr. Janice Norrie since I will be in [place].  I have reviewed the risks of bleeding, infection, ureteral injury, need for stent and secondary procedures, thrombotic events and anesthetic complications.

## 2014-01-19 NOTE — Discharge Instructions (Signed)

## 2014-01-19 NOTE — Anesthesia Preprocedure Evaluation (Addendum)
Anesthesia Evaluation  Patient identified by MRN, date of birth, ID band Patient awake    Reviewed: Allergy & Precautions, H&P , NPO status , Patient's Chart, lab work & pertinent test results  Airway Mallampati: II TM Distance: >3 FB Neck ROM: Full    Dental no notable dental hx.    Pulmonary neg pulmonary ROS,  breath sounds clear to auscultation  Pulmonary exam normal       Cardiovascular negative cardio ROS  Rhythm:Regular Rate:Normal     Neuro/Psych negative neurological ROS  negative psych ROS   GI/Hepatic negative GI ROS, Neg liver ROS,   Endo/Other  negative endocrine ROS  Renal/GU negative Renal ROS  negative genitourinary   Musculoskeletal negative musculoskeletal ROS (+)   Abdominal   Peds negative pediatric ROS (+)  Hematology negative hematology ROS (+)   Anesthesia Other Findings   Reproductive/Obstetrics negative OB ROS                           Anesthesia Physical Anesthesia Plan  ASA: II  Anesthesia Plan: General   Post-op Pain Management:    Induction: Intravenous  Airway Management Planned: LMA  Additional Equipment:   Intra-op Plan:   Post-operative Plan: Extubation in OR  Informed Consent: I have reviewed the patients History and Physical, chart, labs and discussed the procedure including the risks, benefits and alternatives for the proposed anesthesia with the patient or authorized representative who has indicated his/her understanding and acceptance.   Dental advisory given  Plan Discussed with: CRNA  Anesthesia Plan Comments:         Anesthesia Quick Evaluation

## 2014-01-19 NOTE — Anesthesia Postprocedure Evaluation (Signed)
  Anesthesia Post-op Note  Patient: Michael Diaz  Procedure(s) Performed: Procedure(s) (LRB): CYSTOSCOPY WITH RIGHT URETEROSCOPY WITH STONE EXTRACTION WITH ZERO TIP BASKET AND STENT PLACEMENT, RGP RIGHT (Right)  Patient Location: PACU  Anesthesia Type: General  Level of Consciousness: awake and alert   Airway and Oxygen Therapy: Patient Spontanous Breathing  Post-op Pain: mild  Post-op Assessment: Post-op Vital signs reviewed, Patient's Cardiovascular Status Stable, Respiratory Function Stable, Patent Airway and No signs of Nausea or vomiting  Last Vitals:  Filed Vitals:   01/19/14 1115  BP: 120/70  Pulse: 74  Temp: 36.7 C  Resp: 16    Post-op Vital Signs: stable   Complications: No apparent anesthesia complications

## 2014-01-19 NOTE — Anesthesia Procedure Notes (Signed)
Procedure Name: LMA Insertion Date/Time: 01/19/2014 8:59 AM Performed by: Denna Haggard D Pre-anesthesia Checklist: Patient identified, Emergency Drugs available, Suction available and Patient being monitored Patient Re-evaluated:Patient Re-evaluated prior to inductionOxygen Delivery Method: Circle System Utilized Preoxygenation: Pre-oxygenation with 100% oxygen Intubation Type: IV induction Ventilation: Mask ventilation without difficulty LMA: LMA inserted LMA Size: 5.0 Number of attempts: 1 Airway Equipment and Method: bite block Placement Confirmation: positive ETCO2 Tube secured with: Tape Dental Injury: Teeth and Oropharynx as per pre-operative assessment

## 2014-01-19 NOTE — Transfer of Care (Signed)
Immediate Anesthesia Transfer of Care Note  Patient: Michael Diaz  Procedure(s) Performed: Procedure(s) (LRB): CYSTOSCOPY WITH RIGHT URETEROSCOPY WITH STONE EXTRACTION WITH ZERO TIP BASKET AND STENT PLACEMENT, RGP RIGHT (Right)  Patient Location: PACU  Anesthesia Type: General  Level of Consciousness: awake, oriented, sedated and patient cooperative  Airway & Oxygen Therapy: Patient Spontanous Breathing and Patient connected to face mask oxygen  Post-op Assessment: Report given to PACU RN and Post -op Vital signs reviewed and stable  Post vital signs: Reviewed and stable  Complications: No apparent anesthesia complications

## 2014-01-19 NOTE — Op Note (Signed)
VALTON SCHWARTZ is a 58 y.o.   01/19/2014  General  Pre-op diagnosis: Right distal ureteral calculus.  Postop diagnosis: Same  Procedure done: Cystoscopy, right retrograde pyelogram, ureteroscopy, stone extraction, double-J stent insertion  Surgeon: Charlene Brooke. Marka Treloar  Anesthesia: General  Indication: Patient is a 58 years old male who started having  right flank pain 4 days ago. The pain was severe and associated with nausea. He went to the emergency room where a CT scan showed a 3.5 mm stone in the right distal ureter. He has not passed the stone and he has remained symptomatic. KUB in the office yesterday showed the stone in the same location. He is scheduled today for cystoscopy and stone manipulation  Procedure: Patient was identified by his wrist band and proper timeout was taken.  Under general anesthesia he was prepped and draped and placed in the dorsolithotomy position. A panendoscope was inserted in the bladder. The anterior urethra is normal. He has trilobar prostatic hypertrophy with a large median lobe. The bladder mucosa is normal. There is no stone or tumor in the bladder. The ureteral orifices are in normal position and shape.  Right retrograde pyelogram:  The cone-tip catheter was passed through the cystoscope and the right ureteral orifice. Contrast was injected through the cone-tip catheter. There is a filling defect in the distal ureter proximal to the UVJ consistent with the ureteral stone. The cone-tip catheter was removed. A sensor wire was passed through the cystoscope and the right ureter into the renal pelvis. The bladder was emptied and the cystoscope removed. A 6.5 French semirigid ureteroscope was then passed in the bladder and through the right ureteral orifice. The ureteroscope could not be advanced beyond the intramural ureter. The ureteroscope was removed. The intramural ureter was then dilated with the inner sheath of a ureteroscope access sheath. The ureteroscope  access sheath was removed. The ureteroscope was then inserted in the bladder and passed the through the intramural ureter without difficulty. The stone was identified in the ureter. It is small enough to be removed without fragmentation. A 0 tip Nitinol basket was passed through the ureteroscope and the stone was caught within the wires of the basket and extracted. The stone was retrieved and will be given to the patient. He will bring it back to the office for composition analysis. The ureteroscope was then inserted in the ureter. There is no evidence of stone in the ureter.  Second retrograde pyelogram:  Contrast was injected through the ureteroscope. The ureter is moderately dilated. There is no filling defect in the ureter and there is no extravasation of contrast. The renal pelvis and calyces are normal. The ureteroscope was then removed. The sensor wire was then backloaded into the cystoscope and #6 [place] - 24 double-J stent was passed over the sensor wire. The proximal curl of the double-J stent is in the renal pelvis; the distal curl is in the bladder. A string was left attached to the double-J stent. The bladder was then emptied and the cystoscope and sensor wire were removed.  Patient tolerated the procedure well and left the or in satisfactory condition to postanesthesia care unit.

## 2014-01-22 ENCOUNTER — Encounter (HOSPITAL_BASED_OUTPATIENT_CLINIC_OR_DEPARTMENT_OTHER): Payer: Self-pay | Admitting: Urology

## 2014-04-30 ENCOUNTER — Ambulatory Visit: Payer: PRIVATE HEALTH INSURANCE | Admitting: Family

## 2014-08-22 ENCOUNTER — Other Ambulatory Visit: Payer: Self-pay | Admitting: Urology

## 2014-08-29 NOTE — Patient Instructions (Addendum)
Michael Diaz  08/29/2014   Your procedure is scheduled on: Tuesday 09/11/2014  Report to [facility] Main  Entrance and follow signs to               [place] at 5:30 AM.  Call this number if you have problems the morning of surgery [phone]   Remember:  Do not eat food or drink liquids :After Midnight.   Take these medicines the morning of surgery with A SIP OF WATER: NONE                               You may not have any metal on your body including hair pins and              piercings  Do not wear jewelry, make-up, lotions, powders or perfumes.             Do not wear nail polish.  Do not shave  48 hours prior to surgery.              Men may shave face and neck.   Do not bring valuables to the hospital. [place].  Contacts, dentures or bridgework may not be worn into surgery.  Leave suitcase in the car. After surgery it may be brought to your room.     Patients discharged the day of surgery will not be allowed to drive home.  Name and phone number of your driver: Michael Diaz [phone]  Special Instructions: N/A              Please read over the following fact sheets you were given: _____________________________________________________________________             [facility] - Preparing for Surgery Before surgery, you can play an important role.  Because skin is not sterile, your skin needs to be as free of germs as possible.  You can reduce the number of germs on your skin by washing with CHG (chlorahexidine gluconate) soap before surgery.  CHG is an antiseptic cleaner which kills germs and bonds with the skin to continue killing germs even after washing. Please DO NOT use if you have an allergy to CHG or antibacterial soaps.  If your skin becomes reddened/irritated stop using the CHG and inform your nurse when you arrive at Short Stay. Do not shave (including legs and underarms) for at least  48 hours prior to the first CHG shower.  You may shave your face/neck. Please follow these instructions carefully:  1.  Shower with CHG Soap the night before surgery and the  morning of Surgery.  2.  If you choose to wash your hair, wash your hair first as usual with your  normal  shampoo.  3.  After you shampoo, rinse your hair and body thoroughly to remove the  shampoo.                             4.  Use CHG as you would any other liquid soap.  You can apply chg directly  to the skin and wash  Gently with a scrungie or clean washcloth.  5.  Apply the CHG Soap to your body ONLY FROM THE NECK DOWN.   Do not use on face/ open                           Wound or open sores. Avoid contact with eyes, ears mouth and genitals (private parts).                       Wash face,  Genitals (private parts) with your normal soap.             6.  Wash thoroughly, paying special attention to the area where your surgery  will be performed.  7.  Thoroughly rinse your body with warm water from the neck down.  8.  DO NOT shower/wash with your normal soap after using and rinsing off  the CHG Soap.                9.  Pat yourself dry with a clean towel.            10.  Wear clean pajamas.            11.  Place clean sheets on your bed the night of your first shower and do not  sleep with pets. Day of Surgery : Do not apply any lotions/deodorants the morning of surgery.  Please wear clean clothes to the hospital/surgery center.  FAILURE TO FOLLOW THESE INSTRUCTIONS MAY RESULT IN THE CANCELLATION OF YOUR SURGERY PATIENT SIGNATURE_________________________________  NURSE SIGNATURE__________________________________  ________________________________________________________________________   Michael Diaz  An incentive spirometer is a tool that can help keep your lungs clear and active. This tool measures how well you are filling your lungs with each breath. Taking long deep breaths may  help reverse or decrease the chance of developing breathing (pulmonary) problems (especially infection) following:  A long period of time when you are unable to move or be active. BEFORE THE PROCEDURE   If the spirometer includes an indicator to show your best effort, your nurse or respiratory therapist will set it to a desired goal.  If possible, sit up straight or lean slightly forward. Try not to slouch.  Hold the incentive spirometer in an upright position. INSTRUCTIONS FOR USE  1. Sit on the edge of your bed if possible, or sit up as far as you can in bed or on a chair. 2. Hold the incentive spirometer in an upright position. 3. Breathe out normally. 4. Place the mouthpiece in your mouth and seal your lips tightly around it. 5. Breathe in slowly and as deeply as possible, raising the piston or the ball toward the top of the column. 6. Hold your breath for 3-5 seconds or for as long as possible. Allow the piston or ball to fall to the bottom of the column. 7. Remove the mouthpiece from your mouth and breathe out normally. 8. Rest for a few seconds and repeat Steps 1 through 7 at least 10 times every 1-2 hours when you are awake. Take your time and take a few normal breaths between deep breaths. 9. The spirometer may include an indicator to show your best effort. Use the indicator as a goal to work toward during each repetition. 10. After each set of 10 deep breaths, practice coughing to be sure your lungs are clear. If you have an incision (the cut made at the time of surgery),  support your incision when coughing by placing a pillow or rolled up towels firmly against it. Once you are able to get out of bed, walk around indoors and cough well. You may stop using the incentive spirometer when instructed by your caregiver.  RISKS AND COMPLICATIONS  Take your time so you do not get dizzy or light-headed.  If you are in pain, you may need to take or ask for pain medication before doing  incentive spirometry. It is harder to take a deep breath if you are having pain. AFTER USE  Rest and breathe slowly and easily.  It can be helpful to keep track of a log of your progress. Your caregiver can provide you with a simple table to help with this. If you are using the spirometer at home, follow these instructions: [place] IF:   You are having difficultly using the spirometer.  You have trouble using the spirometer as often as instructed.  Your pain medication is not giving enough relief while using the spirometer.  You develop fever of 100.5 F (38.1 C) or higher. SEEK IMMEDIATE MEDICAL CARE IF:   You cough up bloody sputum that had not been present before.  You develop fever of 102 F (38.9 C) or greater.  You develop worsening pain at or near the incision site. MAKE SURE YOU:   Understand these instructions.  Will watch your condition.  Will get help right away if you are not doing well or get worse. Document Released: 09/21/2006 Document Revised: 08/03/2011 Document Reviewed: 11/22/2006 [facility] Patient Information 2014 [place], [place].   ________________________________________________________________________

## 2014-08-31 ENCOUNTER — Encounter (HOSPITAL_COMMUNITY)
Admission: RE | Admit: 2014-08-31 | Discharge: 2014-08-31 | Disposition: A | Discharge status: Home / Self Care | Payer: BLUE CROSS/BLUE SHIELD | Source: Ambulatory Visit | Attending: Urology | Admitting: Urology

## 2014-08-31 ENCOUNTER — Encounter (HOSPITAL_COMMUNITY): Payer: Self-pay

## 2014-08-31 DIAGNOSIS — Z01818 Encounter for other preprocedural examination: Secondary | ICD-10-CM | POA: Insufficient documentation

## 2014-08-31 LAB — BASIC METABOLIC PANEL
Anion gap: 9 (ref 5–15)
BUN: 14 mg/dL (ref 6–23)
CO2: 26 mmol/L (ref 19–32)
Calcium: 8.9 mg/dL (ref 8.4–10.5)
Chloride: 104 mmol/L (ref 96–112)
Creatinine, Ser: 0.88 mg/dL (ref 0.50–1.35)
Glucose, Bld: 99 mg/dL (ref 70–99)
POTASSIUM: 4 mmol/L (ref 3.5–5.1)
SODIUM: 139 mmol/L (ref 135–145)

## 2014-08-31 LAB — CBC
HEMATOCRIT: 40.2 % (ref 39.0–52.0)
HEMOGLOBIN: 13.1 g/dL (ref 13.0–17.0)
MCH: 28.3 pg (ref 26.0–34.0)
MCHC: 32.6 g/dL (ref 30.0–36.0)
MCV: 86.8 fL (ref 78.0–100.0)
Platelets: 324 10*3/uL (ref 150–400)
RBC: 4.63 MIL/uL (ref 4.22–5.81)
RDW: 13.6 % (ref 11.5–15.5)
WBC: 7.6 10*3/uL (ref 4.0–10.5)

## 2014-09-10 NOTE — H&P (Signed)
History of Present Illness Consultation for hypotonic bladder and incomplete bladder emptying referred by Dr. Janice Norrie. PCP Dr. Lysle Rubens. The patient was in August 2015 when he underwent right ureteroscopy for distal stone. At that time it was noted he had trilobar hypertrophy and a large median lobe. On follow-up ultrasound he had no hydronephrosis but he had a postvoid of 520 mL. November 2015 his DRE was normal. He tried Rapaflo but ultimately went on tamsulosin 0.8 mg daily at bedtime. Postvoid decreased to 364 mL and patient was started on dutasteride. Since then postvoids have ranged from 236-275 mL. Urodynamics revealed a large capacity, hyposensitive bladder. Bladder was stable. He voided with pressures around 40 cm of water but low flow. Postvoid uroflow also showed low flow and a breadloaf pattern.     Currently the patient continued tamsulosin 0.8 mg. He continues dutasteride. Patient has a weak stream and hesitancy. At times he does void with a good stream. He has no frequency, urgency or incontinence.   Past Medical History Problems  1. History of esophageal reflux (Z87.19) 2. History of hypercholesterolemia (Z86.39) 3. History of Melanoma of trunk (C43.59)  Surgical History Problems  1. History of Chemosurgery (Mohs Micrographic Technique) 2. History of Cystoscopy With Insertion Of Ureteral Stent Right 3. History of Cystoscopy With Ureteroscopy With Removal Of Calculus 4. History of Inguinal Hernia Repair  Current Meds 1. Cephalexin 500 MG Oral Capsule; TAKE 1 CAPSULE 4 TIMES DAILY UNTIL GONE;  Therapy: [id number] to (Evaluate:29Dec2015)  Requested for: [id number]; Last  Rx:14Dec2015 Ordered 2. Ciprofloxacin HCl - 500 MG Oral Tablet; TAKE 1 TABLET BID STARTING THE DAY  BEFORE PROCEDURE;  Therapy: [id number] to (Last Rx:12Jan2016)  Requested for: 12Jan2016 Ordered 3. Dutasteride 0.5 MG Oral Capsule; TAKE 1 CAPSULE EVERY DAY;  Therapy: [id number] to (Evaluate:07May2016)  Requested for:  [id number]; Last  Rx:09Nov2015 Ordered 4. Tamsulosin HCl - 0.4 MG Oral Capsule; TAKE 2 CAPSULE Bedtime;  Therapy: [id number] to (Last Rx:02Nov2015)  Requested for: [id number] Ordered  Allergies Medication  1. OxyCODONE HCl TABS  Family History Problems  1. Family history of Alzheimer's disease (Z82.0) : Mother  Social History Problems    Alcohol use (Z78.9)   Caffeine use (F15.90)   Married   Never a smoker   Number of children   Occupation  Review of Systems Constitutional, skin, eye, otolaryngeal, hematologic/lymphatic, cardiovascular, pulmonary, endocrine, musculoskeletal, gastrointestinal, neurological and psychiatric system(s) were reviewed and pertinent findings if present are noted and are otherwise negative.    Physical Exam Constitutional: Well nourished and well developed . No acute distress.  Neuro/Psych:. Mood and affect are appropriate.    Assessment Assessed  1. Benign localized prostatic hyperplasia with lower urinary tract symptoms (LUTS) (N40.1)  Plan Benign localized prostatic hyperplasia with lower urinary tract symptoms (LUTS)  1. Follow-up Schedule Surgery Office  Follow-up  Status: Complete  Done: [id number] 2. PSA REFLEX TO FREE; Status:Hold For - Specimen/Data Collection; Requested  for:29Mar2016;   Discussion/Summary BPH with incomplete bladder emptying-large capacity hyposensitive possible hypotonic bladder-we discussed the nature risks and benefits of alpha blockers, 5 alpha reductase inhibitors, procedures such as microwave therapy, greenlight photo vaporization of the prostate and transurethral resection of the prostate. The patient would like to get off of medicines if possible and would like to have a better stream. I would favor greenlight photo vaporization of the prostate due to its shorter recovery time. We did discuss risks such as damage to adjacent structures such as the pelvic floor/bladder neck/trigone/ ureteral orifices, bleeding,  stricture, incontinence, erectile dysfunction, de novo frequency/urgency, prolonged dysuria requiring steroid among other risks. We discussed slightly higher risk of failure to improve emptying and stream due to his large capacity and slightly hypotonic bladder. All questions answered. All questions answered. Patient and his wife would like to schedule greenlight photo vaporization of the prostate. We discussed prostate procedures are not permanent and the prostate slowly grows back, but hopefully this would take a few years. We went over the relevant anatomy and they watched a video animation of the procedure.    Prostate cancer screening-patient did have a normal digital rectal exam. I did not see a recent PSA and I recommended it we discussed the nature risk and benefits of PSA screening and is something we would want to know about before we did a procedure for BPH. All questions answered. PSA was sent.      Signatures Electronically signed by : Festus Aloe, M.D.; Aug 16 2014  5:16PM EST  Add: PSA 0.36

## 2014-09-10 NOTE — Anesthesia Preprocedure Evaluation (Addendum)
Anesthesia Evaluation  Patient identified by MRN, date of birth, ID band Patient awake    Reviewed: Allergy & Precautions, NPO status , Patient's Chart, lab work & pertinent test results, reviewed documented beta blocker date and time   Airway Mallampati: II   Neck ROM: Full    Dental  (+) Teeth Intact, Dental Advisory Given   Pulmonary neg pulmonary ROS,  breath sounds clear to auscultation        Cardiovascular negative cardio ROS  Rhythm:Regular     Neuro/Psych negative neurological ROS  negative psych ROS   GI/Hepatic Neg liver ROS, GERD-  Medicated,  Endo/Other  negative endocrine ROS  Renal/GU negative Renal ROS     Musculoskeletal   Abdominal (+)  Abdomen: soft.    Peds  Hematology 13/40   Anesthesia Other Findings   Reproductive/Obstetrics                            Anesthesia Physical Anesthesia Plan  ASA: II  Anesthesia Plan: General   Post-op Pain Management:    Induction:   Airway Management Planned: Oral ETT  Additional Equipment:   Intra-op Plan:   Post-operative Plan: Extubation in OR  Informed Consent: I have reviewed the patients History and Physical, chart, labs and discussed the procedure including the risks, benefits and alternatives for the proposed anesthesia with the patient or authorized representative who has indicated his/her understanding and acceptance.     Plan Discussed with:   Anesthesia Plan Comments: (Multimodal pain Rx)        Anesthesia Quick Evaluation

## 2014-09-11 ENCOUNTER — Ambulatory Visit (HOSPITAL_COMMUNITY): Payer: BLUE CROSS/BLUE SHIELD | Admitting: Anesthesiology

## 2014-09-11 ENCOUNTER — Ambulatory Visit (HOSPITAL_COMMUNITY)
Admission: RE | Admit: 2014-09-11 | Discharge: 2014-09-11 | Disposition: A | Discharge status: Home / Self Care | Payer: BLUE CROSS/BLUE SHIELD | Source: Ambulatory Visit | Attending: Urology | Admitting: Urology

## 2014-09-11 ENCOUNTER — Encounter (HOSPITAL_COMMUNITY)
Admission: RE | Disposition: A | Discharge status: Home / Self Care | Payer: Self-pay | Source: Ambulatory Visit | Attending: Urology

## 2014-09-11 ENCOUNTER — Encounter (HOSPITAL_COMMUNITY): Payer: Self-pay | Admitting: *Deleted

## 2014-09-11 DIAGNOSIS — F1590 Other stimulant use, unspecified, uncomplicated: Secondary | ICD-10-CM | POA: Diagnosis not present

## 2014-09-11 DIAGNOSIS — K219 Gastro-esophageal reflux disease without esophagitis: Secondary | ICD-10-CM | POA: Diagnosis not present

## 2014-09-11 DIAGNOSIS — N138 Other obstructive and reflux uropathy: Secondary | ICD-10-CM

## 2014-09-11 DIAGNOSIS — Z85820 Personal history of malignant melanoma of skin: Secondary | ICD-10-CM | POA: Diagnosis not present

## 2014-09-11 DIAGNOSIS — E780 Pure hypercholesterolemia: Secondary | ICD-10-CM | POA: Insufficient documentation

## 2014-09-11 DIAGNOSIS — N401 Enlarged prostate with lower urinary tract symptoms: Secondary | ICD-10-CM | POA: Diagnosis present

## 2014-09-11 HISTORY — PX: GREEN LIGHT LASER TURP (TRANSURETHRAL RESECTION OF PROSTATE: SHX6260

## 2014-09-11 SURGERY — GREEN LIGHT LASER TURP (TRANSURETHRAL RESECTION OF PROSTATE
Anesthesia: General

## 2014-09-11 MED ORDER — LACTATED RINGERS IV SOLN
INTRAVENOUS | Status: DC | PRN
Start: 2014-09-11 — End: 2014-09-11
  Administered 2014-09-11: 1000 mL
  Administered 2014-09-11: 07:00:00 via INTRAVENOUS

## 2014-09-11 MED ORDER — PROPOFOL 10 MG/ML IV BOLUS
INTRAVENOUS | Status: AC
  Filled 2014-09-11: qty 20

## 2014-09-11 MED ORDER — CEFAZOLIN SODIUM-DEXTROSE 2-3 GM-% IV SOLR
2.0000 g | INTRAVENOUS | Status: AC
  Administered 2014-09-11: 2 g via INTRAVENOUS

## 2014-09-11 MED ORDER — LABETALOL HCL 5 MG/ML IV SOLN
INTRAVENOUS | Status: AC
  Filled 2014-09-11: qty 4

## 2014-09-11 MED ORDER — BELLADONNA ALKALOIDS-OPIUM 16.2-60 MG RE SUPP
RECTAL | Status: AC
  Filled 2014-09-11: qty 1

## 2014-09-11 MED ORDER — ACETAMINOPHEN 10 MG/ML IV SOLN
1000.0000 mg | Freq: Once | INTRAVENOUS | Status: AC
  Administered 2014-09-11: 1000 mg via INTRAVENOUS
  Filled 2014-09-11: qty 100

## 2014-09-11 MED ORDER — NITROFURANTOIN MONOHYD MACRO 100 MG PO CAPS: 100.0000 mg | ORAL_CAPSULE | Freq: Every day | ORAL | Status: DC

## 2014-09-11 MED ORDER — NEOSTIGMINE METHYLSULFATE 10 MG/10ML IV SOLN
INTRAVENOUS | Status: DC | PRN
  Administered 2014-09-11: 3.5 mg via INTRAVENOUS

## 2014-09-11 MED ORDER — LIDOCAINE HCL (CARDIAC) 20 MG/ML IV SOLN
INTRAVENOUS | Status: AC
  Filled 2014-09-11: qty 5

## 2014-09-11 MED ORDER — FENTANYL CITRATE (PF) 250 MCG/5ML IJ SOLN
INTRAMUSCULAR | Status: AC
  Filled 2014-09-11: qty 5

## 2014-09-11 MED ORDER — BELLADONNA ALKALOIDS-OPIUM 16.2-60 MG RE SUPP
RECTAL | Status: DC | PRN
  Administered 2014-09-11: 1 via RECTAL

## 2014-09-11 MED ORDER — LIDOCAINE HCL (CARDIAC) 20 MG/ML IV SOLN
INTRAVENOUS | Status: DC | PRN
  Administered 2014-09-11: 100 mg via INTRAVENOUS

## 2014-09-11 MED ORDER — DEXAMETHASONE SODIUM PHOSPHATE 10 MG/ML IJ SOLN
INTRAMUSCULAR | Status: DC | PRN
  Administered 2014-09-11: 10 mg via INTRAVENOUS

## 2014-09-11 MED ORDER — SODIUM CHLORIDE 0.9 % IR SOLN
Status: DC | PRN
  Administered 2014-09-11: 10000 mL

## 2014-09-11 MED ORDER — MIDAZOLAM HCL 5 MG/5ML IJ SOLN
INTRAMUSCULAR | Status: DC | PRN
  Administered 2014-09-11: 2 mg via INTRAVENOUS

## 2014-09-11 MED ORDER — MIDAZOLAM HCL 2 MG/2ML IJ SOLN
INTRAMUSCULAR | Status: AC
  Filled 2014-09-11: qty 2

## 2014-09-11 MED ORDER — PROMETHAZINE HCL 25 MG/ML IJ SOLN: 6.2500 mg | INTRAMUSCULAR | Status: DC | PRN

## 2014-09-11 MED ORDER — CEFAZOLIN SODIUM-DEXTROSE 2-3 GM-% IV SOLR
INTRAVENOUS | Status: AC
  Filled 2014-09-11: qty 50

## 2014-09-11 MED ORDER — ONDANSETRON HCL 4 MG/2ML IJ SOLN
INTRAMUSCULAR | Status: DC | PRN
  Administered 2014-09-11: 4 mg via INTRAVENOUS

## 2014-09-11 MED ORDER — FENTANYL CITRATE (PF) 100 MCG/2ML IJ SOLN
INTRAMUSCULAR | Status: AC
  Filled 2014-09-11: qty 2

## 2014-09-11 MED ORDER — FENTANYL CITRATE (PF) 250 MCG/5ML IJ SOLN
INTRAMUSCULAR | Status: DC | PRN
  Administered 2014-09-11: 100 ug via INTRAVENOUS
  Administered 2014-09-11 (×3): 50 ug via INTRAVENOUS

## 2014-09-11 MED ORDER — ISOPROPYL ALCOHOL 70 % SOLN
Status: AC
  Filled 2014-09-11: qty 480

## 2014-09-11 MED ORDER — FENTANYL CITRATE (PF) 100 MCG/2ML IJ SOLN
25.0000 ug | INTRAMUSCULAR | Status: DC | PRN
  Administered 2014-09-11: 50 ug via INTRAVENOUS

## 2014-09-11 MED ORDER — PROPOFOL 10 MG/ML IV BOLUS
INTRAVENOUS | Status: DC | PRN
  Administered 2014-09-11: 200 mg via INTRAVENOUS

## 2014-09-11 MED ORDER — ROCURONIUM BROMIDE 100 MG/10ML IV SOLN
INTRAVENOUS | Status: DC | PRN
  Administered 2014-09-11: 10 mg via INTRAVENOUS
  Administered 2014-09-11: 40 mg via INTRAVENOUS

## 2014-09-11 MED ORDER — GLYCOPYRROLATE 0.2 MG/ML IJ SOLN
INTRAMUSCULAR | Status: DC | PRN
  Administered 2014-09-11: 0.4 mg via INTRAVENOUS

## 2014-09-11 MED ORDER — MEPERIDINE HCL 50 MG/ML IJ SOLN: 6.2500 mg | INTRAMUSCULAR | Status: DC | PRN

## 2014-09-11 MED ORDER — ROCURONIUM BROMIDE 100 MG/10ML IV SOLN
INTRAVENOUS | Status: AC
  Filled 2014-09-11: qty 1

## 2014-09-11 SURGICAL SUPPLY — 15 items
BAG URINE DRAINAGE (UROLOGICAL SUPPLIES) ×2 IMPLANT
BAG URO CATCHER STRL LF (DRAPE) ×2 IMPLANT
CATH TIEMANN FOLEY 18FR 5CC (CATHETERS) ×1 IMPLANT
DRAPE CAMERA CLOSED 9X96 (DRAPES) ×2 IMPLANT
FEE RENTAL LASER GREENLIGHT (Laser) ×1 IMPLANT
GLOVE BIOGEL M STRL SZ7.5 (GLOVE) ×2 IMPLANT
GOWN STRL REUS W/TWL XL LVL3 (GOWN DISPOSABLE) ×2 IMPLANT
HOLDER FOLEY CATH W/STRAP (MISCELLANEOUS) IMPLANT
LASER FIBER /GREENLIGHT LASER (Laser) ×2 IMPLANT
LASER GREENLIGHT RENTAL P/PROC (Laser) ×2 IMPLANT
MANIFOLD NEPTUNE II (INSTRUMENTS) ×2 IMPLANT
PACK CYSTO (CUSTOM PROCEDURE TRAY) ×2 IMPLANT
SYR 30ML LL (SYRINGE) IMPLANT
SYRINGE IRR TOOMEY STRL 70CC (SYRINGE) IMPLANT
TUBING CONNECTING 10 (TUBING) ×2 IMPLANT

## 2014-09-11 NOTE — Transfer of Care (Signed)
Immediate Anesthesia Transfer of Care Note  Patient: Michael Diaz  Procedure(s) Performed: Procedure(s): GREEN LIGHT LASER TURP (TRANSURETHRAL RESECTION OF PROSTATE (N/A)  Patient Location: PACU  Anesthesia Type:General  Level of Consciousness: alert , oriented, patient cooperative and responds to stimulation  Airway & Oxygen Therapy: Patient Spontanous Breathing and Patient connected to face mask  Post-op Assessment: Report given to RN, Post -op Vital signs reviewed and stable and Patient moving all extremities X 4  Post vital signs: stable  Last Vitals: There were no vitals filed for this visit.  Complications: No apparent anesthesia complications

## 2014-09-11 NOTE — Anesthesia Procedure Notes (Signed)
Procedure Name: Intubation Date/Time: 09/11/2014 7:57 AM Performed by: Pilar Grammes Pre-anesthesia Checklist: Patient identified, Emergency Drugs available, Suction available, Patient being monitored and Timeout performed Patient Re-evaluated:Patient Re-evaluated prior to inductionOxygen Delivery Method: Circle system utilized Preoxygenation: Pre-oxygenation with 100% oxygen Intubation Type: IV induction Ventilation: Mask ventilation without difficulty Laryngoscope Size: Miller and 3 Grade View: Grade I Tube type: Oral Tube size: 8.0 mm Number of attempts: 1 Airway Equipment and Method: Stylet Placement Confirmation: positive ETCO2,  ETT inserted through vocal cords under direct vision,  CO2 detector and breath sounds checked- equal and bilateral Secured at: 22 cm Tube secured with: Tape Dental Injury: Teeth and Oropharynx as per pre-operative assessment

## 2014-09-11 NOTE — Anesthesia Postprocedure Evaluation (Signed)
  Anesthesia Post-op Note  Patient: Michael Diaz  Procedure(s) Performed: Procedure(s): GREEN LIGHT LASER TURP (TRANSURETHRAL RESECTION OF PROSTATE (N/A)  Patient Location: PACU  Anesthesia Type:General  Level of Consciousness: awake  Airway and Oxygen Therapy: Patient Spontanous Breathing and Patient connected to nasal cannula oxygen  Post-op Pain: mild  Post-op Assessment: Post-op Vital signs reviewed, Patient's Cardiovascular Status Stable and Respiratory Function Stable  Post-op Vital Signs: Reviewed and stable  Last Vitals: There were no vitals filed for this visit.  Complications: No apparent anesthesia complications

## 2014-09-11 NOTE — Discharge Instructions (Signed)
Foley Catheter Care A Foley catheter is a soft, flexible tube that is placed into the bladder to drain urine. A Foley catheter may be inserted if:  You leak urine or are not able to control when you urinate (urinary incontinence).  You are not able to urinate when you need to (urinary retention).  You had prostate surgery or surgery on the genitals.  You have certain medical conditions, such as multiple sclerosis, dementia, or a spinal cord injury. If you are going home with a Foley catheter in place, follow the instructions below. TAKING CARE OF THE CATHETER  Wash your hands with soap and water.  Using mild soap and warm water on a clean washcloth:  Clean the area on your body closest to the catheter insertion site using a circular motion, moving away from the catheter. Never wipe toward the catheter because this could sweep bacteria up into the urethra and cause infection.  Remove all traces of soap. Pat the area dry with a clean towel. For males, reposition the foreskin.  Attach the catheter to your leg so there is no tension on the catheter. Use adhesive tape or a leg strap. If you are using adhesive tape, remove any sticky residue left behind by the previous tape you used.  Keep the drainage bag below the level of the bladder, but keep it off the floor.  Check throughout the day to be sure the catheter is working and urine is draining freely. Make sure the tubing does not become kinked.  Do not pull on the catheter or try to remove it. Pulling could damage internal tissues. TAKING CARE OF THE DRAINAGE BAGS You will be given two drainage bags to take home. One is a large overnight drainage bag, and the other is a smaller leg bag that fits underneath clothing. You may wear the overnight bag at any time, but you should never wear the smaller leg bag at night. Follow the instructions below for how to empty, change, and clean your drainage bags. Emptying the Drainage Bag You must empty  your drainage bag when it is  - full or at least 2-3 times a day.  Wash your hands with soap and water.  Keep the drainage bag below your hips, below the level of your bladder. This stops urine from going back into the tubing and into your bladder.  Hold the dirty bag over the toilet or a clean container.  Open the pour spout at the bottom of the bag and empty the urine into the toilet or container. Do not let the pour spout touch the toilet, container, or any other surface. Doing so can place bacteria on the bag, which can cause an infection.  Clean the pour spout with a gauze pad or cotton ball that has rubbing alcohol on it.  Close the pour spout.  Attach the bag to your leg with adhesive tape or a leg strap.  Wash your hands well. Changing the Drainage Bag Change your drainage bag once a month or sooner if it starts to smell bad or look dirty. Below are steps to follow when changing the drainage bag.  Wash your hands with soap and water.  Pinch off the rubber catheter so that urine does not spill out.  Disconnect the catheter tube from the drainage tube at the connection valve. Do not let the tubes touch any surface.  Clean the end of the catheter tube with an alcohol wipe. Use a different alcohol wipe to clean the  end of the drainage tube.  Connect the catheter tube to the drainage tube of the clean drainage bag.  Attach the new bag to the leg with adhesive tape or a leg strap. Avoid attaching the new bag too tightly.  Wash your hands well. Cleaning the Drainage Bag 1. Wash your hands with soap and water. 2. Wash the bag in warm, soapy water. 3. Rinse the bag thoroughly with warm water. 4. Fill the bag with a solution of white vinegar and water (1 cup vinegar to 1 qt warm water [.2 L vinegar to 1 L warm water]). Close the bag and soak it for 30 minutes in the solution. 5. Rinse the bag with warm water. 6. Hang the bag to dry with the pour spout open and hanging  downward. 7. Store the clean bag (once it is dry) in a clean plastic bag. 8. Wash your hands well. PREVENTING INFECTION  Wash your hands before and after handling your catheter.  Take showers daily and wash the area where the catheter enters your body. Do not take baths. Replace wet leg straps with dry ones, if this applies.  Do not use powders, sprays, or lotions on the genital area. Only use creams, lotions, or ointments as directed by your caregiver.  For females, wipe from front to back after each bowel movement.  Drink enough fluids to keep your urine clear or pale yellow unless you have a fluid restriction.  Do not let the drainage bag or tubing touch or lie on the floor.  Wear cotton underwear to absorb moisture and to keep your skin drier. SEEK MEDICAL CARE IF:   Your urine is cloudy or smells unusually bad.  Your catheter becomes clogged.  You are not draining urine into the bag or your bladder feels full.  Your catheter starts to leak. SEEK IMMEDIATE MEDICAL CARE IF:   You have pain, swelling, redness, or pus where the catheter enters the body.  You have pain in the abdomen, legs, lower back, or bladder.  You have a fever.  You see blood fill the catheter, or your urine is pink or red.  You have nausea, vomiting, or chills.  Your catheter gets pulled out. MAKE SURE YOU:   Understand these instructions.  Will watch your condition.  Will get help right away if you are not doing well or get worse. Document Released: 05/11/2005 Document Revised: 09/25/2013 Document Reviewed: 05/02/2012 [facility] Patient Information 2015 [place], [place]. This information is not intended to replace advice given to you by your health care provider. Make sure you discuss any questions you have with your health care provider.   Prostate Laser Surgery, Care After Refer to this sheet in the next few weeks. These instructions provide you with information on caring for yourself after  your procedure. Your health care provider may also give you more specific instructions. Your treatment has been planned according to current medical practices, but problems sometimes occur. Call your health care provider if you have any problems or questions after your procedure. WHAT TO EXPECT AFTER THE PROCEDURE  You may notice blood in your urine that could last up to 3 weeks.  After your catheter is removed, you will have burning (especially at the tip of your penis) when you urinate, especially at the end of urination. For the first few weeks after your procedure, you will feel the need to urinate often. HOME CARE INSTRUCTIONS   Do not perform vigorous exercise, especially heavy lifting, for 1 week  or as directed by your health care provider.  Avoid sexual activity for 4-6 weeks or as directed by your health care provider.  Do not ride in a car for extended periods for 1 month or as directed by your health care provider.  Do not strain to have a bowel movement. Drink a lot of fluids and and make sure you get enough fiber in your diet.  Drink enough fluids to keep your urine clear or pale yellow. SEEK IMMEDIATE MEDICAL CARE IF:   Your catheter has been removed and you are suddenly unable to urinate.  Your catheter has not been removed, and it develops a blockage.  You start to have blood clots in your urine.  The blood in your urine becomes persistent or gets thick.  Your temperature is greater than 100.25F (38.1C).  You develop chest pains.  You develop shortness of breath.  You develop leg swelling or pain. MAKE SURE YOU:  Understand these instructions.  Will watch your condition.  Will get help right away if you are not doing well or get worse. Document Released: 05/11/2005 Document Revised: 05/16/2013 Document Reviewed: 10/31/2012 [facility] Patient Information 2015 [place], [place]. This information is not intended to replace advice given to you by your health care  provider. Make sure you discuss any questions you have with your health care provider.     General Anesthesia, Care After Refer to this sheet in the next few weeks. These instructions provide you with information on caring for yourself after your procedure. Your health care provider may also give you more specific instructions. Your treatment has been planned according to current medical practices, but problems sometimes occur. Call your health care provider if you have any problems or questions after your procedure. WHAT TO EXPECT AFTER THE PROCEDURE After the procedure, it is typical to experience:  Sleepiness.  Nausea and vomiting. HOME CARE INSTRUCTIONS  For the first 24 hours after general anesthesia:  Have a responsible person with you.  Do not drive a car. If you are alone, do not take public transportation.  Do not drink alcohol.  Do not take medicine that has not been prescribed by your health care provider.  Do not sign important papers or make important decisions.  You may resume a normal diet and activities as directed by your health care provider.  Change bandages (dressings) as directed.  If you have questions or problems that seem related to general anesthesia, call the hospital and ask for the anesthetist or anesthesiologist on call. SEEK MEDICAL CARE IF:  You have nausea and vomiting that continue the day after anesthesia.  You develop a rash. SEEK IMMEDIATE MEDICAL CARE IF:   You have difficulty breathing.  You have chest pain.  You have any allergic problems. Document Released: 08/17/2000 Document Revised: 05/16/2013 Document Reviewed: 11/24/2012 [facility] Patient Information 2015 [place], [place]. This information is not intended to replace advice given to you by your health care provider. Make sure you discuss any questions you have with your health care provider.

## 2014-09-11 NOTE — Interval H&P Note (Signed)
History and Physical Interval Note:  09/11/2014 7:23 AM  Michael Diaz  has presented today for surgery, with the diagnosis of BPH  The various methods of treatment have been discussed with the patient and family. After consideration of risks, benefits and other options for treatment, the patient has consented to  Procedure(s): GREEN LIGHT LASER TURP (TRANSURETHRAL RESECTION OF PROSTATE (N/A) as a surgical intervention .  The patient's history has been reviewed, patient examined, no change in status, stable for surgery.  I have reviewed the patient's chart and labs. I discussed with the patient the nature, potential benefits, risks and alternatives to laser photovaporization, including side effects of the proposed treatment, the likelihood of the patient achieving the goals of the procedure, and any potential problems that might occur during the procedure or recuperation. Patient elects to proceed. Questions were answered to the patient's satisfaction.     Kenni Newton

## 2014-09-11 NOTE — Op Note (Addendum)
Preoperative diagnosis:  1. BPH  Postoperative diagnosis: 1. BPH  Procedure(s): 1.EUA 2. Greenlight laser TURP 3. Urethral dilation  Surgeon: Dr. Festus Aloe Asst: Dr. Amaryllis Dyke, Resident   Anesthesia: General  Complications: None immediate  EBL: Minimal  Specimens: Prostatic urethral polyp  Disposition of specimens: For pathology  Intraoperative findings: Long finger-like polyp extending from the veru to the bladder neck that ball-valved into the urethra and likely causing intermittent obstruction. This was removed and essentially minimal outlet obstruction other than mild bilobar hypertrophy, that was treated. Remainder of bladder unremarkable. Large capacity.   On exam, penis was normal without mass or lesion. Testicles palpably normal. On DRE, prostate was smooth without hard area or nodule. Landmarks preserved. A B&O was placed.   Indication: Poor outflow and poor emptying from bladder outlet obstruction, hypotonic bladder.  Description of procedure: The patient, consent and site were verified prior to bringing them to the OR where they were placed supine on the OR table. SCDs were placed and IV antibiotics were initiated. General anesthesia was induced and the patient was placed in the dorsal lithotomy position where they were prepped and draped in the usual sterile fashion. Pre-procedure time out was called and the case was begun. An EUA was performed and a B&O suppository was placed.  After dilation of the urethral meatus to 30Fr, the 26Fr rigid resectoscope with 30 degree lens was inserted into the bladder per urethra with ample lubrication and normal saline running for irrigation. Complete cystoscopy was performed with the above noted findings.  The polyp was transected at the veru and removed from the bladder. The bilateral lobes were vaporized to open the channel adequately working from bladder neck to veru. Bleeding was controlled and minimal. 18Fr coude was  placed.   The patient was then awoken from general anesthesia having tolerated the procedure well.    Attending attestation: I was present for the entire procedure and scrubbed for the entire procedure except for foley placement.

## 2014-09-12 ENCOUNTER — Encounter (HOSPITAL_COMMUNITY): Payer: Self-pay | Admitting: Urology

## 2014-10-01 ENCOUNTER — Other Ambulatory Visit (HOSPITAL_COMMUNITY): Payer: Self-pay | Admitting: Dermatology

## 2015-09-11 ENCOUNTER — Encounter (HOSPITAL_COMMUNITY): Payer: Self-pay | Admitting: *Deleted

## 2015-09-11 ENCOUNTER — Other Ambulatory Visit: Payer: Self-pay | Admitting: Gastroenterology

## 2015-09-23 ENCOUNTER — Ambulatory Visit (HOSPITAL_COMMUNITY): Payer: BLUE CROSS/BLUE SHIELD | Admitting: Anesthesiology

## 2015-09-23 ENCOUNTER — Encounter (HOSPITAL_COMMUNITY): Payer: Self-pay | Admitting: *Deleted

## 2015-09-23 ENCOUNTER — Encounter (HOSPITAL_COMMUNITY)
Admission: RE | Disposition: A | Discharge status: Home / Self Care | Payer: Self-pay | Source: Ambulatory Visit | Attending: Gastroenterology

## 2015-09-23 ENCOUNTER — Ambulatory Visit (HOSPITAL_COMMUNITY)
Admission: RE | Admit: 2015-09-23 | Discharge: 2015-09-23 | Disposition: A | Discharge status: Home / Self Care | Payer: BLUE CROSS/BLUE SHIELD | Source: Ambulatory Visit | Attending: Gastroenterology | Admitting: Gastroenterology

## 2015-09-23 DIAGNOSIS — D123 Benign neoplasm of transverse colon: Secondary | ICD-10-CM | POA: Diagnosis not present

## 2015-09-23 DIAGNOSIS — Z1211 Encounter for screening for malignant neoplasm of colon: Secondary | ICD-10-CM | POA: Diagnosis present

## 2015-09-23 DIAGNOSIS — Z85820 Personal history of malignant melanoma of skin: Secondary | ICD-10-CM | POA: Diagnosis not present

## 2015-09-23 DIAGNOSIS — E7800 Pure hypercholesterolemia, unspecified: Secondary | ICD-10-CM | POA: Insufficient documentation

## 2015-09-23 HISTORY — PX: COLONOSCOPY WITH PROPOFOL: SHX5780

## 2015-09-23 SURGERY — COLONOSCOPY WITH PROPOFOL
Anesthesia: Monitor Anesthesia Care

## 2015-09-23 MED ORDER — SODIUM CHLORIDE 0.9 % IV SOLN: INTRAVENOUS | Status: DC

## 2015-09-23 MED ORDER — LIDOCAINE HCL (CARDIAC) 20 MG/ML IV SOLN
INTRAVENOUS | Status: AC
  Filled 2015-09-23: qty 5

## 2015-09-23 MED ORDER — PROPOFOL 10 MG/ML IV BOLUS
INTRAVENOUS | Status: AC
  Filled 2015-09-23: qty 40

## 2015-09-23 MED ORDER — PROPOFOL 500 MG/50ML IV EMUL
INTRAVENOUS | Status: DC | PRN
  Administered 2015-09-23: 120 ug/kg/min via INTRAVENOUS

## 2015-09-23 MED ORDER — PROPOFOL 500 MG/50ML IV EMUL
INTRAVENOUS | Status: DC | PRN
  Administered 2015-09-23: 50 mg via INTRAVENOUS
  Administered 2015-09-23: 20 mg via INTRAVENOUS

## 2015-09-23 MED ORDER — LACTATED RINGERS IV SOLN
INTRAVENOUS | Status: DC
  Administered 2015-09-23: 13:00:00 via INTRAVENOUS

## 2015-09-23 MED ORDER — PROPOFOL 10 MG/ML IV BOLUS
INTRAVENOUS | Status: AC
  Filled 2015-09-23: qty 20

## 2015-09-23 SURGICAL SUPPLY — 22 items

## 2015-09-23 NOTE — Anesthesia Preprocedure Evaluation (Addendum)
Anesthesia Evaluation  Patient identified by MRN, date of birth, ID band Patient awake    Reviewed: Allergy & Precautions, NPO status , Patient's Chart, lab work & pertinent test results  Airway Mallampati: II  TM Distance: >3 FB Neck ROM: Full    Dental   Pulmonary neg pulmonary ROS,    breath sounds clear to auscultation       Cardiovascular negative cardio ROS   Rhythm:Regular Rate:Normal     Neuro/Psych    GI/Hepatic Neg liver ROS, GERD  ,  Endo/Other  negative endocrine ROS  Renal/GU negative Renal ROS     Musculoskeletal   Abdominal   Peds  Hematology   Anesthesia Other Findings   Reproductive/Obstetrics                             Anesthesia Physical Anesthesia Plan  ASA: II  Anesthesia Plan:    Post-op Pain Management:    Induction: Intravenous  Airway Management Planned: LMA  Additional Equipment:   Intra-op Plan:   Post-operative Plan: Extubation in OR  Informed Consent: I have reviewed the patients History and Physical, chart, labs and discussed the procedure including the risks, benefits and alternatives for the proposed anesthesia with the patient or authorized representative who has indicated his/her understanding and acceptance.     Plan Discussed with: CRNA and Anesthesiologist  Anesthesia Plan Comments:         Anesthesia Quick Evaluation

## 2015-09-23 NOTE — H&P (Signed)
  Procedure: Screening colonoscopy. Normal screening colonoscopy performed on 11/30/2005  History: The patient is a 60 year old male born 12-08-1955. He is scheduled to undergo a repeat screening colonoscopy today.  Past medical history: Tonsillectomy. Bilateral inguinal hernia repairs. Hemorrhoid surgery. Kidney stone surgery. Urethral polypectomy. Melanoma surgery. Cervical disc disease. Hypercholesterolemia.  Medication allergies: Flomax caused ejaculation problems  Exam: The patient is alert and lying comfortably on the endoscopy stretcher. Abdomen is soft and nontender to palpation. Lungs are clear to auscultation. Cardiac exam reveals a regular rhythm.  Plan: Proceed with screening colonoscopy

## 2015-09-23 NOTE — Transfer of Care (Signed)
Immediate Anesthesia Transfer of Care Note  Patient: Michael Diaz  Procedure(s) Performed: Procedure(s): COLONOSCOPY WITH PROPOFOL (N/A)  Patient Location: PACU  Anesthesia Type:MAC  Level of Consciousness: awake, alert  and oriented  Airway & Oxygen Therapy: Patient Spontanous Breathing and Patient connected to face mask oxygen  Post-op Assessment: Report given to RN and Post -op Vital signs reviewed and stable  Post vital signs: Reviewed and stable  Last Vitals:  Filed Vitals:   09/23/15 1227  BP: 124/74  Pulse: 68  Temp: 36.7 C  Resp: 17    Last Pain: There were no vitals filed for this visit.       Complications: No apparent anesthesia complications

## 2015-09-23 NOTE — Discharge Instructions (Signed)

## 2015-09-23 NOTE — Op Note (Signed)
[facility] Patient Name: Michael Diaz Procedure Date: 09/23/2015 MRN: [id number] Attending MD: Garlan Fair , MD Date of Birth: 1955/08/11 CSN:  Age: 60 Admit Type: Outpatient Procedure:                Colonoscopy Indications:              Screening for colorectal malignant neoplasm. Normal                            screening colonoscopy was performed on 11/30/2005. Providers:                Garlan Fair, MD, Laverta Baltimore, RN, Alfonso Patten, Technician, Rosario Adie, CRNA Referring MD:              Medicines:                Propofol per Anesthesia Complications:            No immediate complications. Estimated Blood Loss:     Estimated blood loss: none. Procedure:                Pre-Anesthesia Assessment:                           - Prior to the procedure, a History and Physical                            was performed, and patient medications and                            allergies were reviewed. The patient's tolerance of                            previous anesthesia was also reviewed. The risks                            and benefits of the procedure and the sedation                            options and risks were discussed with the patient.                            All questions were answered, and informed consent                            was obtained. Prior Anticoagulants: The patient has                            taken no previous anticoagulant or antiplatelet                            agents. ASA Grade Assessment: II - A patient with  mild systemic disease. After reviewing the risks                            and benefits, the patient was deemed in                            satisfactory condition to undergo the procedure.                           After obtaining informed consent, the colonoscope                            was passed under direct vision. Throughout the              procedure, the patient's blood pressure, pulse, and                            oxygen saturations were monitored continuously. The                            [id number] scope was introduced through                            the anus and advanced to the the cecum, identified                            by appendiceal orifice and ileocecal valve. The                            colonoscopy was performed without difficulty. The                            patient tolerated the procedure well. The quality                            of the bowel preparation was good. The ileocecal                            valve, the appendiceal orifice and the rectum were                            photographed. Scope In: 1:27:48 PM Scope Out: 1:50:04 PM Scope Withdrawal Time: 0 hours 11 minutes 44 seconds  Total Procedure Duration: 0 hours 22 minutes 16 seconds  Findings:      The perianal and digital rectal examinations were normal.      A 3 mm polyp was found in the proximal transverse colon. The polyp was       sessile. The polyp was removed with a cold biopsy forceps. Resection and       retrieval were complete.      The exam was otherwise without abnormality. Impression:               - One 3 mm polyp in the proximal transverse colon,  removed with a cold biopsy forceps. Resected and                            retrieved.                           - The examination was otherwise normal. Moderate Sedation:      N/A- Per Anesthesia Care Recommendation:           - Patient has a contact number available for                            emergencies. The signs and symptoms of potential                            delayed complications were discussed with the                            patient. Return to normal activities tomorrow.                            Written discharge instructions were provided to the                            patient.                            - Repeat colonoscopy date to be determined after                            pending pathology results are reviewed for                            surveillance.                           - Resume previous diet.                           - Continue present medications. Procedure Code(s):        --- Professional ---                           [id number], Colonoscopy, flexible; with biopsy, single                            or multiple Diagnosis Code(s):        --- Professional ---                           Z12.11, Encounter for screening for malignant                            neoplasm of colon                           D12.3, Benign neoplasm of transverse colon (hepatic  flexure or splenic flexure) CPT copyright 2016 [employer]. All rights reserved. The codes documented in this report are preliminary and upon coder review may  be revised to meet current compliance requirements. Earle Gell, MD Garlan Fair, MD 09/23/2015 1:56:21 PM This report has been signed electronically. Number of Addenda: 0

## 2015-09-24 ENCOUNTER — Encounter (HOSPITAL_COMMUNITY): Payer: Self-pay | Admitting: Gastroenterology

## 2015-09-24 NOTE — Anesthesia Postprocedure Evaluation (Signed)
Anesthesia Post Note  Patient: Michael Diaz  Procedure(s) Performed: Procedure(s) (LRB): COLONOSCOPY WITH PROPOFOL (N/A)  Patient location during evaluation: PACU Anesthesia Type: MAC Level of consciousness: awake Pain management: pain level controlled Respiratory status: spontaneous breathing Cardiovascular status: stable Anesthetic complications: no    Last Vitals:  Filed Vitals:   09/23/15 1420 09/23/15 1425  BP: 114/83 123/87  Pulse: 67 58  Temp:    Resp: 11 14    Last Pain: There were no vitals filed for this visit.               EDWARDS,Rayshad Riviello

## 2016-01-14 IMAGING — CT CT ABD-PELV W/ CM
1 of 3 series · 14 of 32 positions shown, 19 images · IV contrast (OMNIPAQUE 300)
Comparison: None.

CLINICAL DATA: Right lower quadrant pain, back pain and emesis.
Melanoma skin resection 6 months ago.

EXAM:
CT ABDOMEN AND PELVIS WITH CONTRAST
TECHNIQUE: Multidetector CT imaging of the abdomen and pelvis was performed
using the standard protocol following bolus administration of
intravenous contrast.
CONTRAST:  50mL OMNIPAQUE IOHEXOL 300 MG/ML SOLN, 100mL OMNIPAQUE
IOHEXOL 300 MG/ML SOLN

[Series 2: abd/pel with · axial · 0.75mm/px · z∈[+1023,+1473]mm · 14 of 102 slices shown, 19 images]
[im 6/102  soft-tissue]
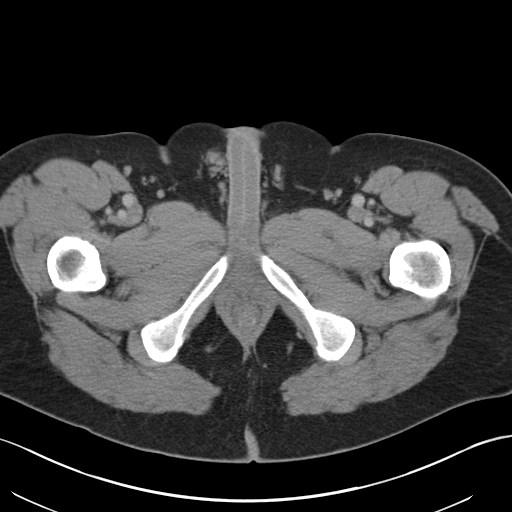
[im 6/102  bone]
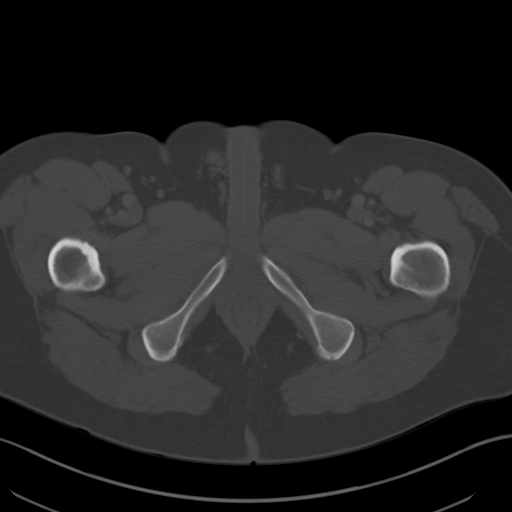
[im 12/102  soft-tissue]
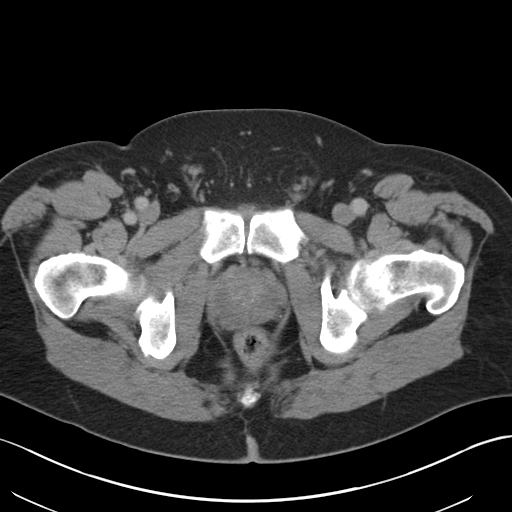
[im 23/102  soft-tissue]
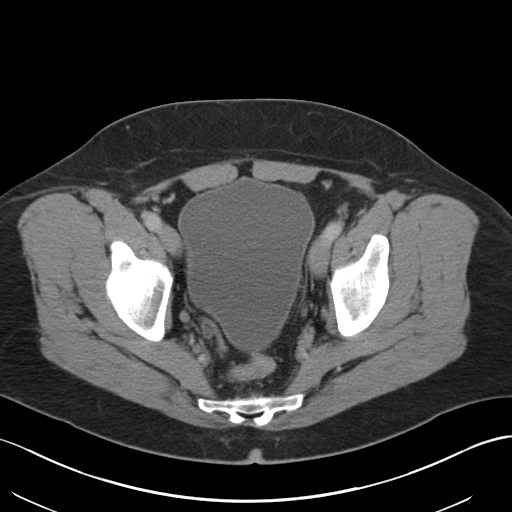
[im 29/102  soft-tissue]
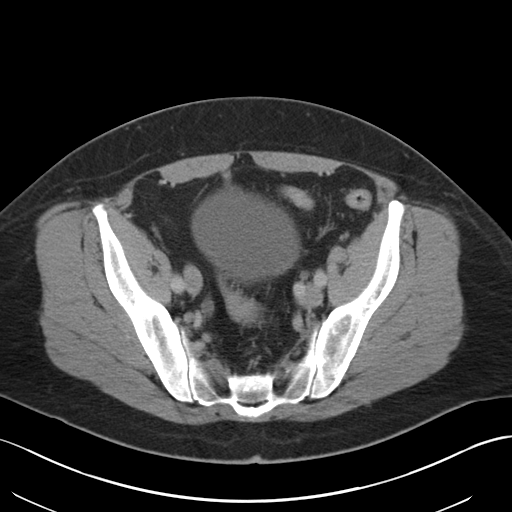
[im 34/102  soft-tissue]
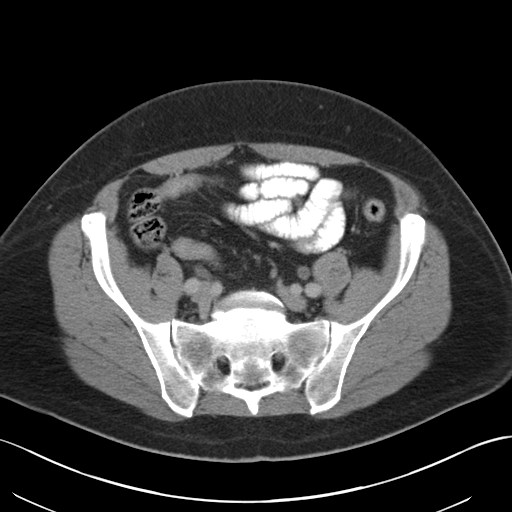
[im 45/102  soft-tissue]
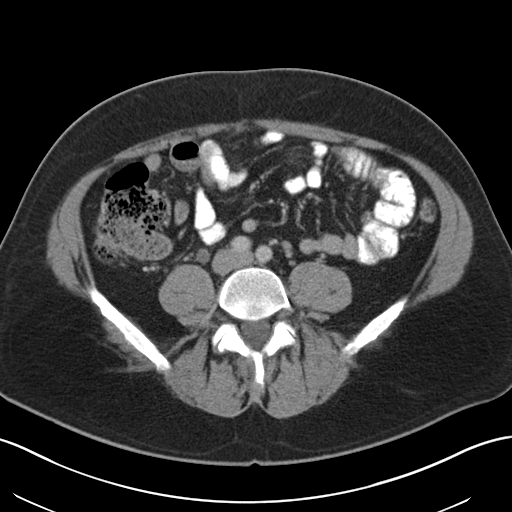
[im 51/102  soft-tissue]
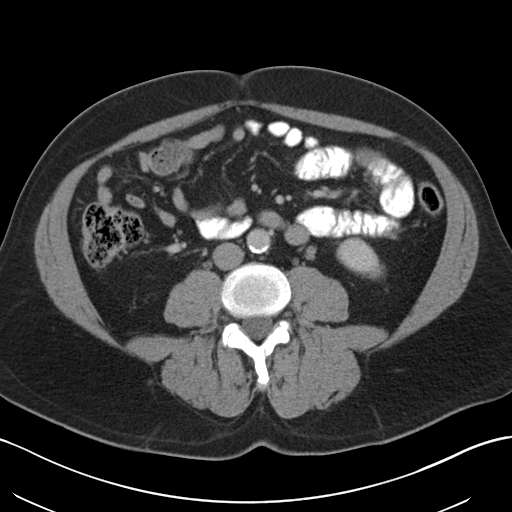
[im 57/102  soft-tissue]
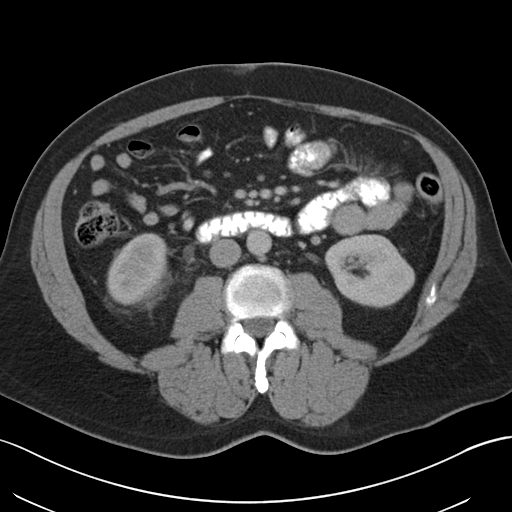
[im 68/102  soft-tissue]
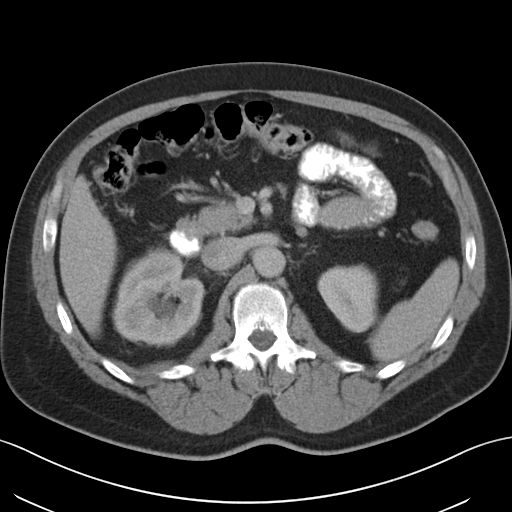
[im 68/102  bone]
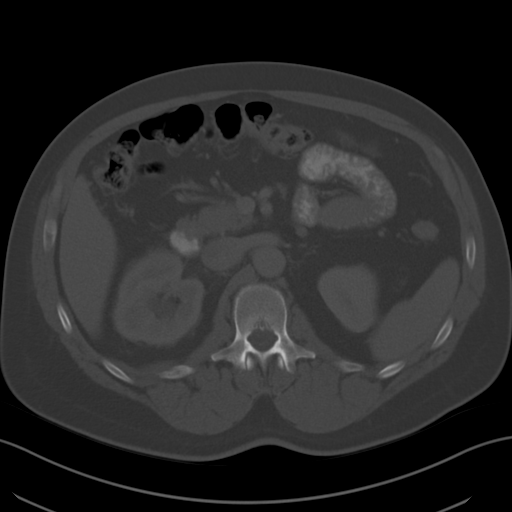
[im 73/102  soft-tissue]
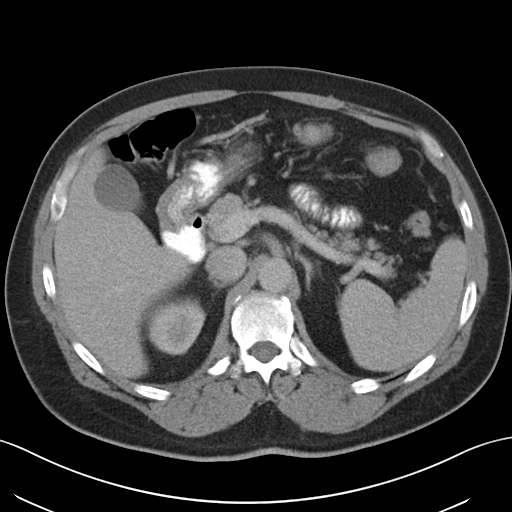
[im 79/102  soft-tissue]
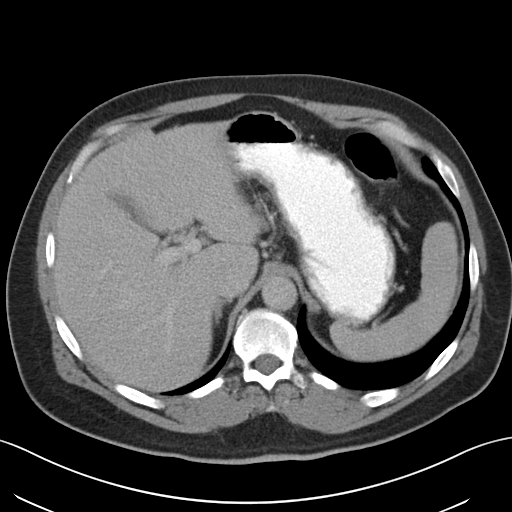
[im 79/102  lung]
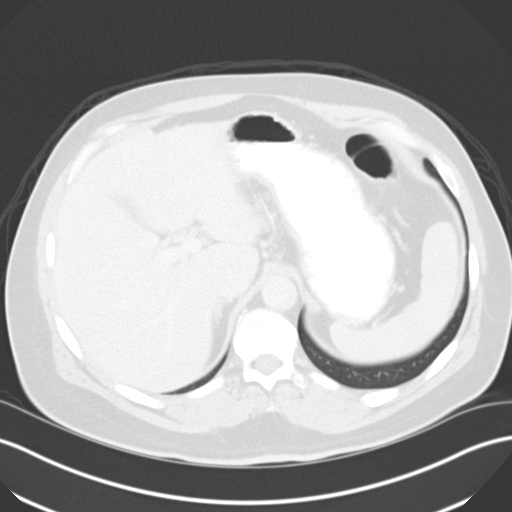
[im 85/102  lung]
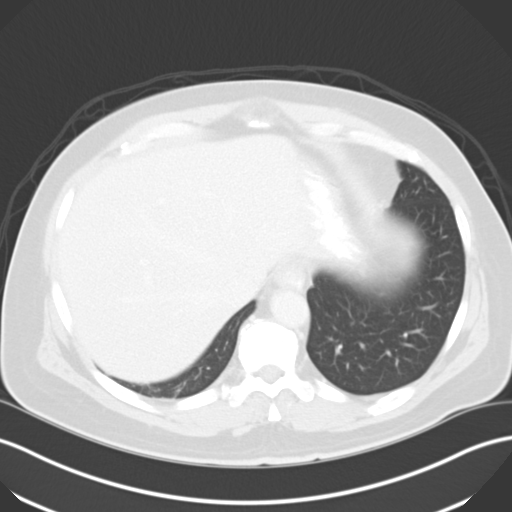
[im 90/102  soft-tissue]
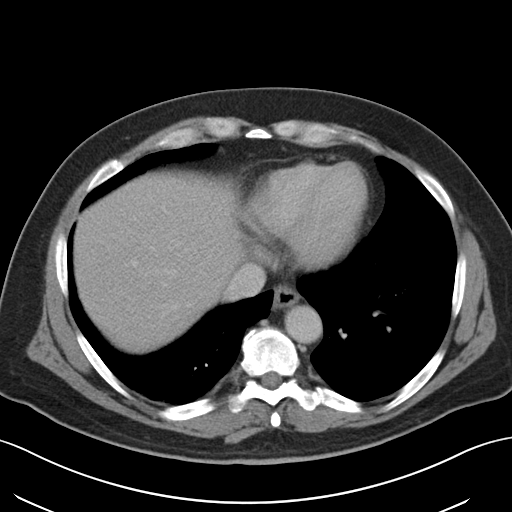
[im 90/102  lung]
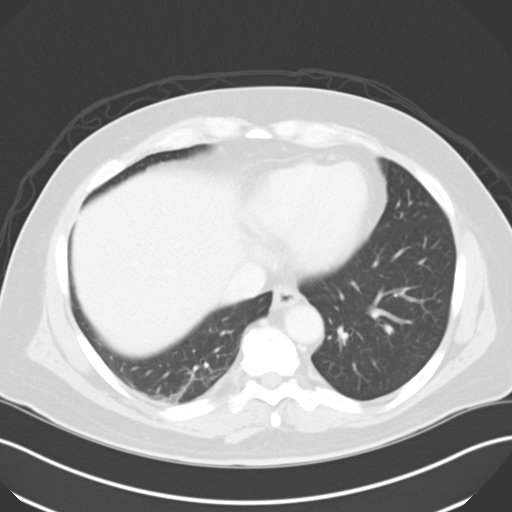
[im 96/102  soft-tissue]
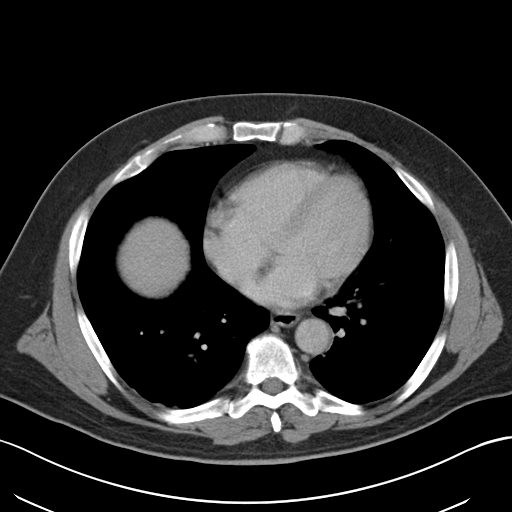
[im 96/102  lung]
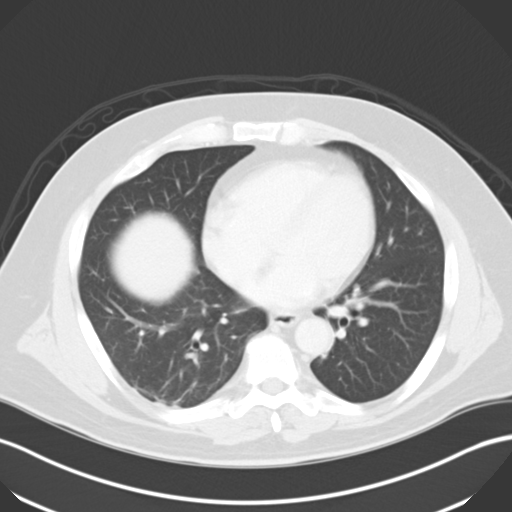

[14 of 32 positions shown; findings below may reference images not displayed]

FINDINGS: Lung bases demonstrate mild linear scarring versus atelectasis in
the posterior right base.

Abdominal images demonstrate a normal liver, spleen, pancreas,
gallbladder and adrenal glands. Appendix is normal. There is minimal
calcified plaque over the distal abdominal aorta just above the
bifurcation.

Kidneys are normal in size with mild right-sided hydronephrosis and
right perinephric stranding/ fluid. No evidence of nephrolithiasis.
There is mild dilatation of the right ureter as there is a 3.5 mm
stone over the distal right ureter just above the UVJ. Left ureter
is normal.

Pelvic images demonstrate minimal bladder distention. The prostate
and rectosigmoid colon are within normal. There are multiple pelvic
phleboliths present. There are mild degenerate changes of the spine
and hips. There is a grade 1 anterolisthesis of L5 on S1 due to
bilateral L5 spondylolysis.
IMPRESSION: 3.5 mm stone over the distal right ureter just above the UVJ causing
low-grade obstruction.

Grade 1 anterolisthesis of L5 on S1 due to bilateral L5
spondylolysis.
